# Patient Record
Sex: Female | Born: 1960
Health system: Southern US, Community
[De-identification: ages and names within clinical notes are randomized; demographics above are authoritative.]

## PROBLEM LIST (undated history)

## (undated) DIAGNOSIS — D6851 Activated protein C resistance: Secondary | ICD-10-CM

## (undated) DIAGNOSIS — K219 Gastro-esophageal reflux disease without esophagitis: Secondary | ICD-10-CM

## (undated) DIAGNOSIS — E119 Type 2 diabetes mellitus without complications: Secondary | ICD-10-CM

## (undated) DIAGNOSIS — N858 Other specified noninflammatory disorders of uterus: Secondary | ICD-10-CM

## (undated) DIAGNOSIS — J45990 Exercise induced bronchospasm: Secondary | ICD-10-CM

## (undated) HISTORY — DX: Activated protein C resistance: D68.51

## (undated) HISTORY — PX: WISDOM TOOTH EXTRACTION: SHX21

---

## 1994-02-28 HISTORY — PX: DILATION AND CURETTAGE OF UTERUS: SHX78

## 1997-04-01 ENCOUNTER — Encounter (HOSPITAL_COMMUNITY): Admission: RE | Admit: 1997-04-01 | Discharge: 1997-05-13 | Payer: Self-pay | Admitting: *Deleted

## 1997-04-04 ENCOUNTER — Inpatient Hospital Stay (HOSPITAL_COMMUNITY): Admission: AD | Admit: 1997-04-04 | Discharge: 1997-04-09 | Payer: Self-pay | Admitting: *Deleted

## 1997-04-19 ENCOUNTER — Inpatient Hospital Stay (HOSPITAL_COMMUNITY): Admission: AD | Admit: 1997-04-19 | Discharge: 1997-04-19 | Payer: Self-pay | Admitting: Obstetrics

## 1997-05-03 ENCOUNTER — Inpatient Hospital Stay (HOSPITAL_COMMUNITY): Admission: AD | Admit: 1997-05-03 | Discharge: 1997-05-05 | Payer: Self-pay | Admitting: *Deleted

## 1997-05-11 ENCOUNTER — Inpatient Hospital Stay (HOSPITAL_COMMUNITY): Admission: AD | Admit: 1997-05-11 | Discharge: 1997-05-15 | Payer: Self-pay | Admitting: *Deleted

## 1997-05-27 ENCOUNTER — Inpatient Hospital Stay (HOSPITAL_COMMUNITY): Admission: AD | Admit: 1997-05-27 | Discharge: 1997-05-27 | Payer: Self-pay | Admitting: *Deleted

## 1997-06-24 ENCOUNTER — Inpatient Hospital Stay (HOSPITAL_COMMUNITY): Admission: AD | Admit: 1997-06-24 | Discharge: 1997-06-24 | Payer: Self-pay | Admitting: *Deleted

## 1998-11-06 ENCOUNTER — Inpatient Hospital Stay (HOSPITAL_COMMUNITY): Admission: AD | Admit: 1998-11-06 | Discharge: 1998-11-06 | Payer: Self-pay | Admitting: *Deleted

## 1999-09-10 ENCOUNTER — Other Ambulatory Visit: Admission: RE | Admit: 1999-09-10 | Discharge: 1999-09-10 | Payer: Self-pay | Admitting: Family Medicine

## 2001-06-08 ENCOUNTER — Other Ambulatory Visit: Admission: RE | Admit: 2001-06-08 | Discharge: 2001-06-08 | Payer: Self-pay | Admitting: Family Medicine

## 2004-10-21 ENCOUNTER — Other Ambulatory Visit: Admission: RE | Admit: 2004-10-21 | Discharge: 2004-10-21 | Payer: Self-pay | Admitting: Family Medicine

## 2007-07-17 ENCOUNTER — Other Ambulatory Visit: Admission: RE | Admit: 2007-07-17 | Discharge: 2007-07-17 | Payer: Self-pay | Admitting: Family Medicine

## 2008-11-18 ENCOUNTER — Other Ambulatory Visit: Admission: RE | Admit: 2008-11-18 | Discharge: 2008-11-18 | Payer: Self-pay | Admitting: Family Medicine

## 2014-02-28 HISTORY — PX: COLONOSCOPY: SHX174

## 2015-07-01 ENCOUNTER — Ambulatory Visit (HOSPITAL_BASED_OUTPATIENT_CLINIC_OR_DEPARTMENT_OTHER)
Admission: RE | Admit: 2015-07-01 | Discharge: 2015-07-01 | Disposition: A | Discharge status: Home / Self Care | Payer: 59 | Source: Ambulatory Visit | Attending: Physician Assistant | Admitting: Physician Assistant

## 2015-07-01 ENCOUNTER — Other Ambulatory Visit (HOSPITAL_BASED_OUTPATIENT_CLINIC_OR_DEPARTMENT_OTHER): Payer: Self-pay | Admitting: Physician Assistant

## 2015-07-01 DIAGNOSIS — R229 Localized swelling, mass and lump, unspecified: Secondary | ICD-10-CM

## 2016-03-01 DIAGNOSIS — E119 Type 2 diabetes mellitus without complications: Secondary | ICD-10-CM | POA: Diagnosis not present

## 2016-03-01 DIAGNOSIS — H1030 Unspecified acute conjunctivitis, unspecified eye: Secondary | ICD-10-CM | POA: Diagnosis not present

## 2016-04-19 DIAGNOSIS — K529 Noninfective gastroenteritis and colitis, unspecified: Secondary | ICD-10-CM | POA: Diagnosis not present

## 2016-04-19 DIAGNOSIS — R6889 Other general symptoms and signs: Secondary | ICD-10-CM | POA: Diagnosis not present

## 2016-08-02 DIAGNOSIS — Z1231 Encounter for screening mammogram for malignant neoplasm of breast: Secondary | ICD-10-CM | POA: Diagnosis not present

## 2016-08-12 DIAGNOSIS — E782 Mixed hyperlipidemia: Secondary | ICD-10-CM | POA: Diagnosis not present

## 2016-08-12 DIAGNOSIS — E119 Type 2 diabetes mellitus without complications: Secondary | ICD-10-CM | POA: Diagnosis not present

## 2016-08-12 DIAGNOSIS — R030 Elevated blood-pressure reading, without diagnosis of hypertension: Secondary | ICD-10-CM | POA: Diagnosis not present

## 2016-08-12 DIAGNOSIS — Z0001 Encounter for general adult medical examination with abnormal findings: Secondary | ICD-10-CM | POA: Diagnosis not present

## 2016-09-30 DIAGNOSIS — N950 Postmenopausal bleeding: Secondary | ICD-10-CM | POA: Diagnosis not present

## 2016-09-30 DIAGNOSIS — R319 Hematuria, unspecified: Secondary | ICD-10-CM | POA: Diagnosis not present

## 2016-10-04 ENCOUNTER — Ambulatory Visit (HOSPITAL_BASED_OUTPATIENT_CLINIC_OR_DEPARTMENT_OTHER)
Admission: RE | Admit: 2016-10-04 | Discharge: 2016-10-04 | Disposition: A | Discharge status: Home / Self Care | Payer: 59 | Source: Ambulatory Visit | Attending: Internal Medicine | Admitting: Internal Medicine

## 2016-10-04 ENCOUNTER — Other Ambulatory Visit (HOSPITAL_BASED_OUTPATIENT_CLINIC_OR_DEPARTMENT_OTHER): Payer: Self-pay | Admitting: Internal Medicine

## 2016-10-04 ENCOUNTER — Ambulatory Visit (HOSPITAL_BASED_OUTPATIENT_CLINIC_OR_DEPARTMENT_OTHER): Payer: 59

## 2016-10-04 DIAGNOSIS — N950 Postmenopausal bleeding: Secondary | ICD-10-CM | POA: Insufficient documentation

## 2016-10-10 DIAGNOSIS — N950 Postmenopausal bleeding: Secondary | ICD-10-CM | POA: Diagnosis not present

## 2016-10-12 IMAGING — US US SOFT TISSUE HEAD/NECK
1 series · 14 of 16 positions shown · non-contrast
Comparison: None.

CLINICAL DATA: Palpable subcutaneous nodule of right posterior neck
at base of skull.

EXAM:
ULTRASOUND OF HEAD/NECK SOFT TISSUES
TECHNIQUE: Ultrasound examination of the head and neck soft tissues was
performed in the area of clinical concern.

[Series 1: us soft tissue head/neck · 0.04mm/px · 14 of 16 slices shown]
[im 1/16]
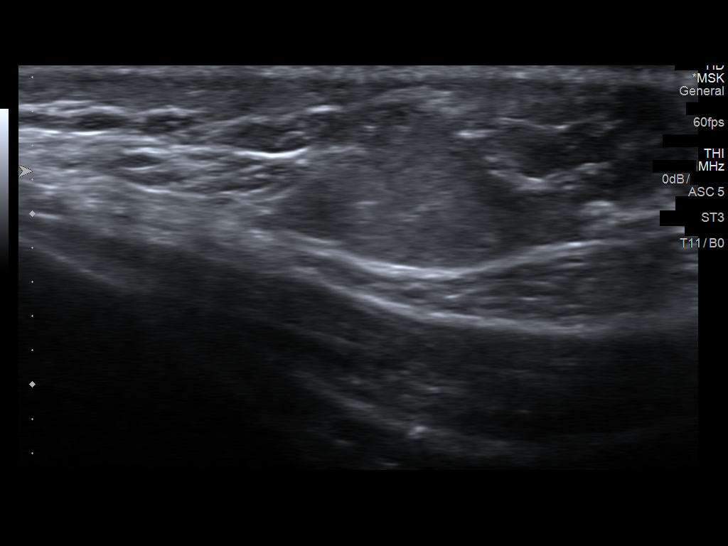
[im 2/16]
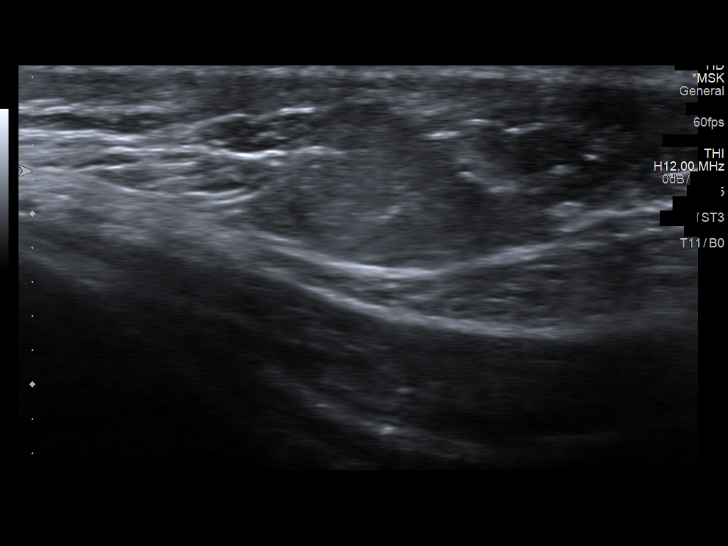
[im 3/16]
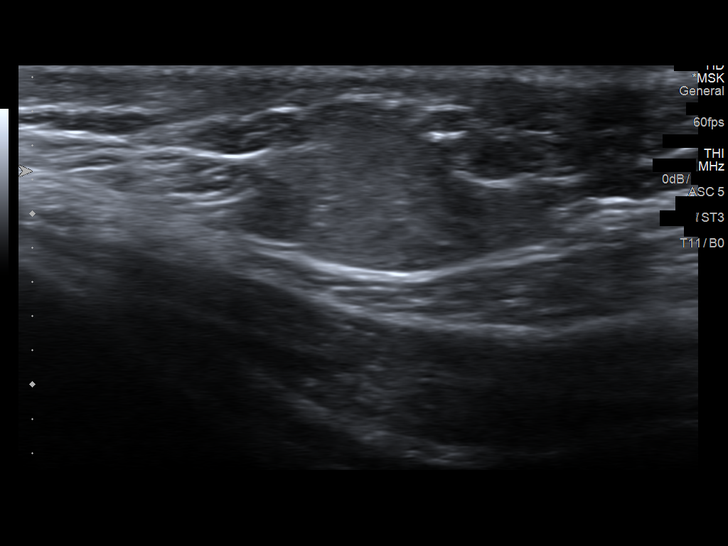
[im 5/16]
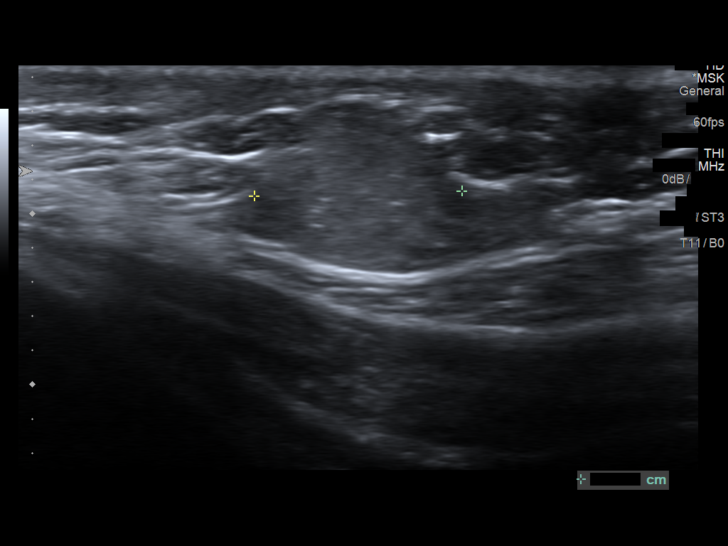
[im 6/16]
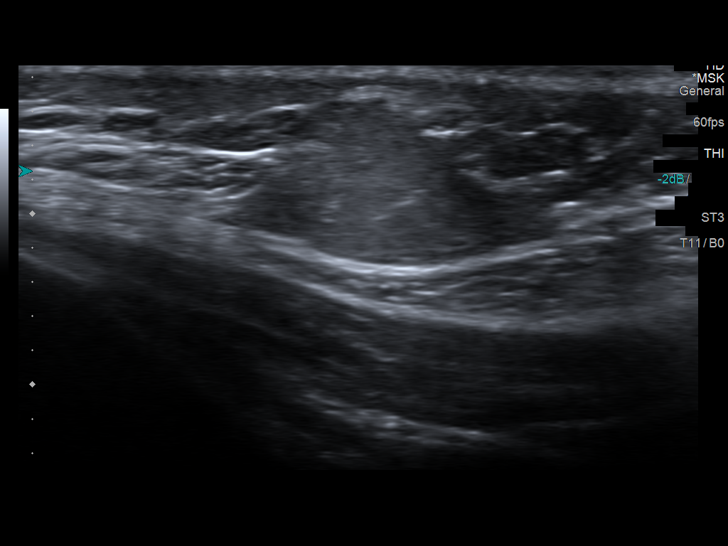
[im 7/16]
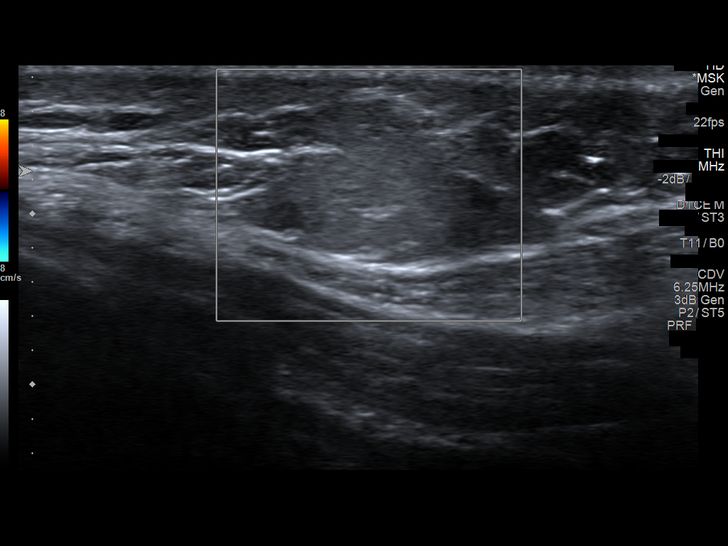
[im 8/16]
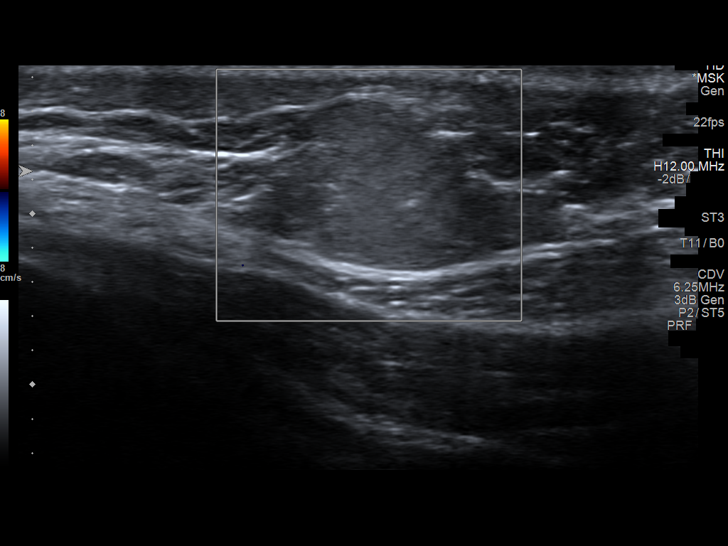
[im 9/16]
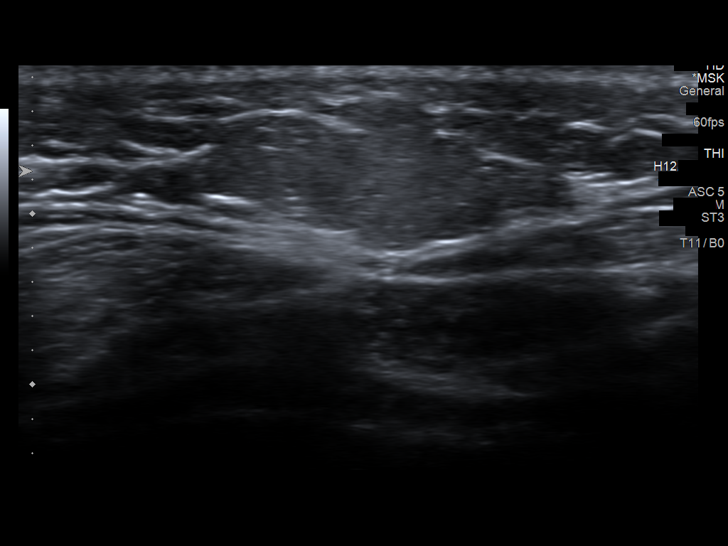
[im 10/16]
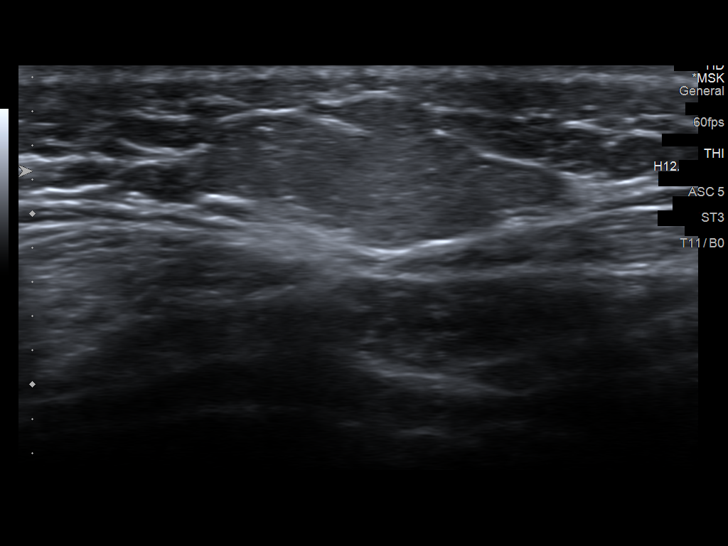
[im 11/16]
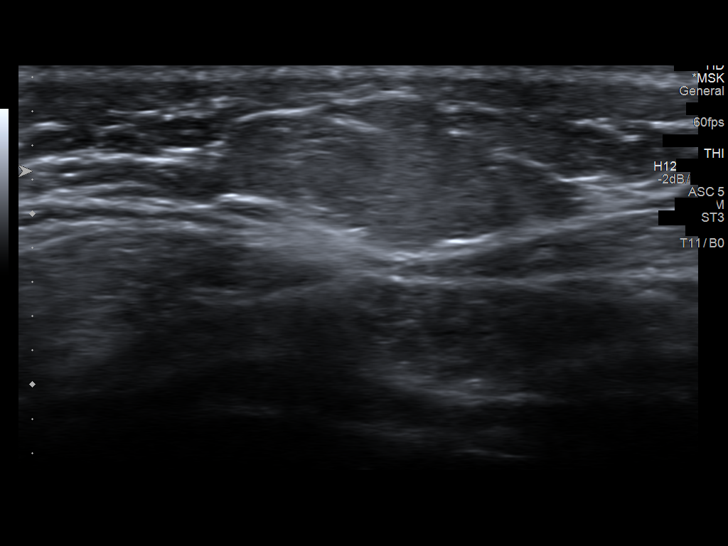
[im 13/16]
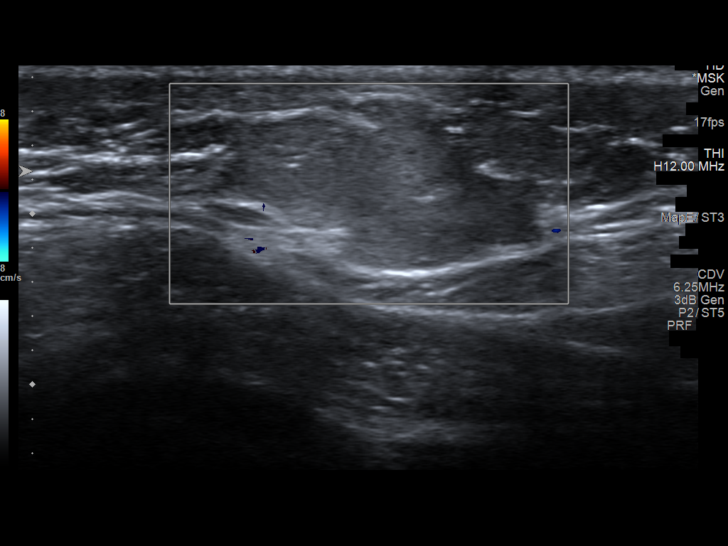
[im 14/16]
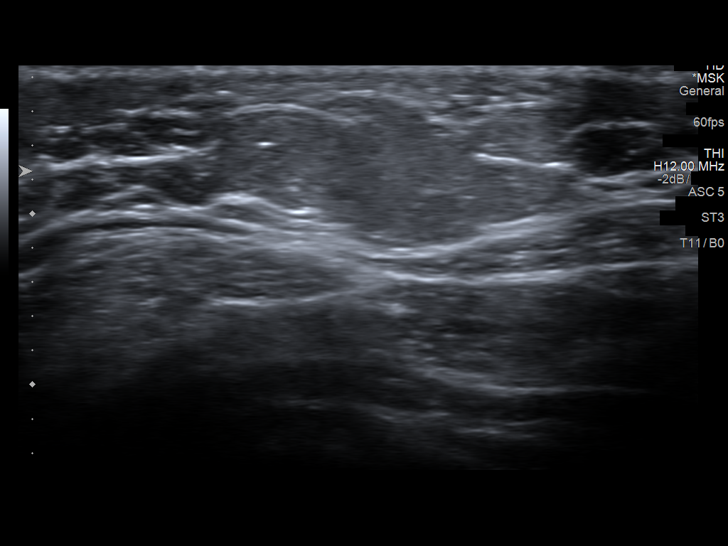
[im 15/16]
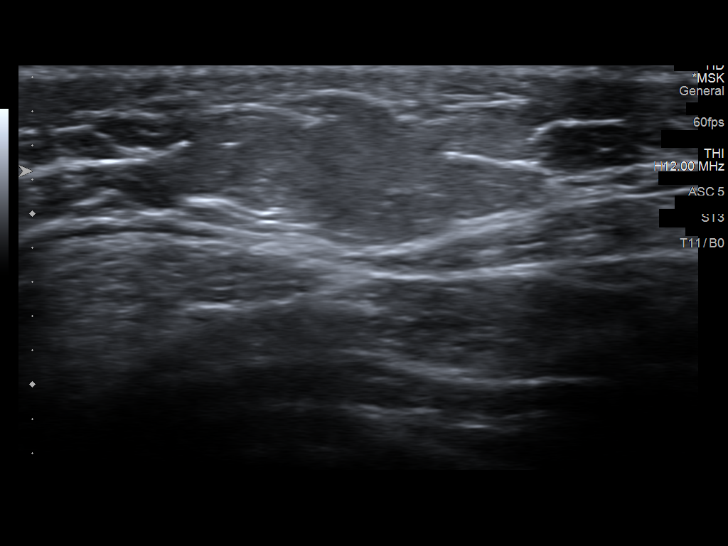
[im 16/16]
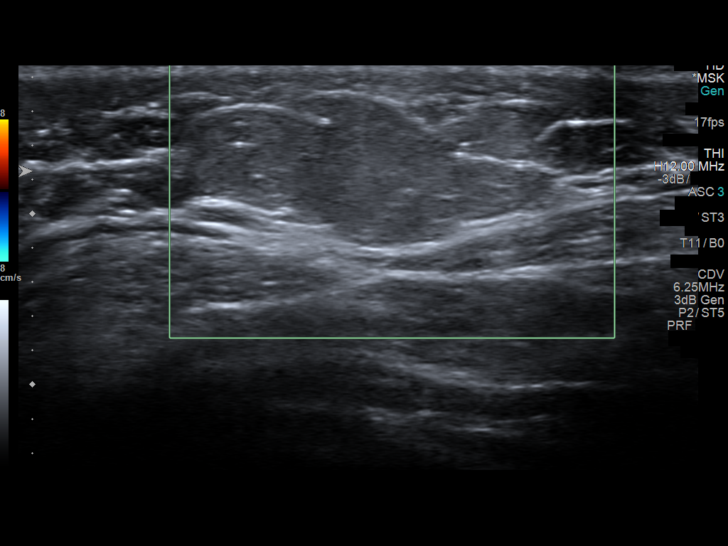

[14 of 16 positions shown; findings below may reference images not displayed]

FINDINGS: In the region of palpable abnormality, a subcutaneous soft tissue is
identified measuring roughly 2.2 x 0.9 x 1.2 cm. This does not have
the appearance of a typical lymph node. Differential considerations
include lipoma, complex sebaceous cyst and soft tissue neoplasm.
IMPRESSION: In the region of palpable abnormality, ultrasound demonstrates
subcutaneous soft tissue nodule measuring 2.2 cm in greatest
diameter. This is nonspecific in appearance by ultrasound.
Differential includes benign and malignant processes. If this region
does not resolve/ enlarges clinically, recommend correlation with CT
of the neck with contrast.

## 2016-10-20 ENCOUNTER — Telehealth: Payer: Self-pay | Admitting: *Deleted

## 2016-10-20 NOTE — Telephone Encounter (Signed)
Spoke with Helene Kelp at [place] and gave the date/time of the appt for August 31st. Patient to be seen by [facility] on August 31st at 1:45pm, arrive at 1:15pm. Helene Kelp will contact the patient

## 2016-10-28 ENCOUNTER — Encounter: Payer: Self-pay | Admitting: Gynecology

## 2016-10-28 ENCOUNTER — Ambulatory Visit: Payer: 59 | Attending: Gynecology | Admitting: Gynecology

## 2016-10-28 VITALS — BP 158/111 | HR 124 | Temp 97.8°F | Resp 18 | Ht 62.75 in | Wt 188.0 lb

## 2016-10-28 DIAGNOSIS — N9489 Other specified conditions associated with female genital organs and menstrual cycle: Secondary | ICD-10-CM

## 2016-10-28 DIAGNOSIS — N950 Postmenopausal bleeding: Secondary | ICD-10-CM | POA: Insufficient documentation

## 2016-10-28 DIAGNOSIS — N858 Other specified noninflammatory disorders of uterus: Secondary | ICD-10-CM | POA: Insufficient documentation

## 2016-10-28 DIAGNOSIS — N859 Noninflammatory disorder of uterus, unspecified: Secondary | ICD-10-CM | POA: Diagnosis not present

## 2016-10-28 DIAGNOSIS — E119 Type 2 diabetes mellitus without complications: Secondary | ICD-10-CM | POA: Insufficient documentation

## 2016-10-28 DIAGNOSIS — Z808 Family history of malignant neoplasm of other organs or systems: Secondary | ICD-10-CM | POA: Diagnosis not present

## 2016-10-28 DIAGNOSIS — R1909 Other intra-abdominal and pelvic swelling, mass and lump: Secondary | ICD-10-CM | POA: Diagnosis not present

## 2016-10-28 DIAGNOSIS — Z7982 Long term (current) use of aspirin: Secondary | ICD-10-CM | POA: Insufficient documentation

## 2016-10-28 DIAGNOSIS — D6851 Activated protein C resistance: Secondary | ICD-10-CM | POA: Insufficient documentation

## 2016-10-28 DIAGNOSIS — R1907 Generalized intra-abdominal and pelvic swelling, mass and lump: Secondary | ICD-10-CM

## 2016-10-28 NOTE — Patient Instructions (Signed)
Plan to have a MRI of the uterus.  We will call you with the results.  Please call for any questions or concerns.

## 2016-10-28 NOTE — Progress Notes (Signed)
Consult Note: Gyn-Onc   Sue Dickson 56 y.o. female  No chief complaint on file.   Assessment : Postmenopausal bleeding and uterine mass on certain etiology.  Plan: I would like to obtain an MRI of the pelvis to further delineate the nature of this mass. I discussed at length with the patient her husband and daughter the differential diagnosis and whether this mass was ultimately resected transcervically (hysteroscopic [place]) or through performing an abdominal hysterectomy. We will make that decision was recently the results of MRI.  HPI: 56 year old white married female gravida 3 para 2 seen in consultation request of Dr.Ozan regarding management of a newly diagnosed uterine mass. The patient presented with postmenopausal bleeding approximate 14 months after undergoing menopause. An transvaginal ultrasound revealed the uterus to measure 10 x 7 x 6.2 cm. The endometrium was thickened at 4.5 cm with a solid mass noted within the endometrial canal measuring 4.6 x 4.2 x 3.9 cm. The ovaries appeared normal. There is no free fluid. The mass was to the fine as having significant internal blood flow. Endometrial biopsy was performed showing proliferative endometrium but no hyperplasia or malignancy.  The patient denies any past gynecologic history. She has no family history of breast ovarian uterine or colon cancer.  Review of Systems:10 point review of systems is negative except as noted in interval history.   Vitals: Blood pressure (!) 158/111, pulse (!) 124, temperature 97.8 F (36.6 C), temperature source Oral, resp. rate 18, height 5' 2.75" (1.594 m), weight 188 lb (85.3 kg), SpO2 100 %.  Physical Exam: General : The patient is a healthy woman in no acute distress.  HEENT: normocephalic, extraoccular movements normal; neck is supple without thyromegally  Lynphnodes: Supraclavicular and inguinal nodes not enlarged  Abdomen: Soft, non-tender, no ascites, no organomegally, no masses, no hernias   Pelvic:  EGBUS: Normal female  Vagina: Normal, no lesions  Urethra and Bladder: Normal, non-tender  Cervix: Normal Uterus: Unable to evaluate given the patient's habitus. Bi-manual examination: Non-tender; no adenxal masses or nodularity  Rectal: normal sphincter tone, no masses, no blood  Lower extremities: No edema or varicosities. Normal range of motion      No Known Allergies  Past Medical History:  Diagnosis Date  . Diabetes mellitus without complication ([place])    Recently diagnosed 10-28-16. Curently diet controled  . Factor 5 Leiden mutation, heterozygous ([place])     No past surgical history on file.  Current Outpatient Prescriptions  Medication Sig Dispense Refill  . ALPRAZolam (XANAX) 0.25 MG tablet Take 0.25 mg by mouth daily as needed for anxiety.    Marland Kitchen aspirin EC 81 MG tablet Take 81 mg by mouth daily.    Marland Kitchen buPROPion (WELLBUTRIN XL) 150 MG 24 hr tablet TAKE 1 TABLET BY MOUTH EVERY DAY IN THE MORNING  5  . cyclobenzaprine (FLEXERIL) 10 MG tablet Take 10 mg by mouth 3 (three) times daily as needed.  0  . esomeprazole (NEXIUM) 20 MG capsule Take 20 mg by mouth every morning.     No current facility-administered medications for this visit.     Social History   Social History  . Marital status: Married    Spouse name: N/A  . Number of children: N/A  . Years of education: N/A   Occupational History  . Not on file.   Social History Main Topics  . Smoking status: Not on file  . Smokeless tobacco: Not on file  . Alcohol use Not on file  . Drug use:  Unknown  . Sexual activity: Not on file   Other Topics Concern  . Not on file   Social History Narrative  . No narrative on file    Family History  Problem Relation Age of Onset  . Uterine cancer Maternal Aunt       Marti Sleigh, MD 10/28/2016, 2:26 PM

## 2016-11-03 ENCOUNTER — Ambulatory Visit (HOSPITAL_COMMUNITY)
Admission: RE | Admit: 2016-11-03 | Discharge: 2016-11-03 | Disposition: A | Discharge status: Home / Self Care | Payer: 59 | Source: Ambulatory Visit | Attending: Gynecologic Oncology | Admitting: Gynecologic Oncology

## 2016-11-03 ENCOUNTER — Encounter (HOSPITAL_COMMUNITY): Payer: Self-pay | Admitting: Radiology

## 2016-11-03 DIAGNOSIS — R1907 Generalized intra-abdominal and pelvic swelling, mass and lump: Secondary | ICD-10-CM | POA: Diagnosis not present

## 2016-11-03 DIAGNOSIS — N950 Postmenopausal bleeding: Secondary | ICD-10-CM | POA: Insufficient documentation

## 2016-11-03 DIAGNOSIS — N9489 Other specified conditions associated with female genital organs and menstrual cycle: Secondary | ICD-10-CM

## 2016-11-03 MED ORDER — GADOBENATE DIMEGLUMINE 529 MG/ML IV SOLN
20.0000 mL | Freq: Once | INTRAVENOUS | Status: AC | PRN
  Administered 2016-11-03: 17 mL via INTRAVENOUS

## 2016-11-07 ENCOUNTER — Telehealth: Payer: Self-pay | Admitting: Gynecologic Oncology

## 2016-11-07 NOTE — Telephone Encounter (Signed)
Spoke with patient about MRI results and Dr. Mora Bellman recommendations for her to be seen to discuss D&C.  Appt made for Sept 12 and D&C scheduled for Sept 19.  All questions answered.

## 2016-11-09 ENCOUNTER — Encounter: Payer: Self-pay | Admitting: Gynecologic Oncology

## 2016-11-09 ENCOUNTER — Ambulatory Visit: Payer: 59 | Attending: Gynecologic Oncology | Admitting: Gynecologic Oncology

## 2016-11-09 ENCOUNTER — Other Ambulatory Visit: Payer: Self-pay | Admitting: Gynecologic Oncology

## 2016-11-09 VITALS — BP 150/100 | HR 108 | Temp 98.0°F | Resp 18 | Wt 188.0 lb

## 2016-11-09 DIAGNOSIS — D6851 Activated protein C resistance: Secondary | ICD-10-CM | POA: Diagnosis not present

## 2016-11-09 DIAGNOSIS — E119 Type 2 diabetes mellitus without complications: Secondary | ICD-10-CM | POA: Insufficient documentation

## 2016-11-09 DIAGNOSIS — Z7982 Long term (current) use of aspirin: Secondary | ICD-10-CM | POA: Insufficient documentation

## 2016-11-09 DIAGNOSIS — N950 Postmenopausal bleeding: Secondary | ICD-10-CM | POA: Insufficient documentation

## 2016-11-09 DIAGNOSIS — N9489 Other specified conditions associated with female genital organs and menstrual cycle: Secondary | ICD-10-CM

## 2016-11-09 DIAGNOSIS — Z79899 Other long term (current) drug therapy: Secondary | ICD-10-CM | POA: Diagnosis not present

## 2016-11-09 DIAGNOSIS — Z808 Family history of malignant neoplasm of other organs or systems: Secondary | ICD-10-CM | POA: Insufficient documentation

## 2016-11-09 DIAGNOSIS — N858 Other specified noninflammatory disorders of uterus: Secondary | ICD-10-CM

## 2016-11-09 NOTE — Progress Notes (Signed)
Consult Note: Gyn-Onc   Sue Dickson 56 y.o. female  Chief Complaint  Patient presents with  . Endometrial mass N94.89]    Assessment : Postmenopausal bleeding and uterine mass on certain etiology.  Plan:We will proceed with a ultrasound-guided D&C and possible hysteroscopy next Wednesday. Her questions were elicited in answer to their satisfaction. They know that we will call them with the results and determine next steps pending them.  HPI: 76 year old white married female gravida 3 para 2 seen in consultation request of Dr.Ozan regarding management of a newly diagnosed uterine mass. The patient presented with postmenopausal bleeding approximate 14 months after undergoing menopause. An transvaginal ultrasound revealed the uterus to measure 10 x 7 x 6.2 cm. The endometrium was thickened at 4.5 cm with a solid mass noted within the endometrial canal measuring 4.6 x 4.2 x 3.9 cm. The ovaries appeared normal. There is no free fluid. The mass was to the fine as having significant internal blood flow. Endometrial biopsy was performed showing proliferative endometrium but no hyperplasia or malignancy.  Reproductive: Uterus is notable for a 4.8 x 3.4 x 3.7 cm enhancing mass in the uterine fundus (series 11/image 18). Mildly irregular margins. Thinning of the overlying uterine fundus with possible myometrial invasion. Overall, this appearance favors endometrial carcinoma, with endometrial polyp and degenerating intracavitary fibroid considered less likely.  If this reflects carcinoma, this would likely reflect stage IB disease. No involvement of the cervix or adjacent structures.  IMPRESSION: 4.8 cm enhancing mass in the uterine fundus, worrisome for endometrial carcinoma (stage IB) in the setting of postmenopausal bleeding.  Greater than 20 minutes face to face time with the patient and her husband reviewing the MRI results as well as next steps. She is scheduled for a D&C next Wednesday with me.  They had a lot of questions about the possibilities and I discussed with them that I cannot offer more information and we'll was provided in the MRI that this could either be an endometrial cancer, a fibroid or polyp. I discussed with them that we would try to remove his metastasis of the mass as possible. If it is an invasive endometrial cancer would not be able to remove the entire cancer as it does involve 12 the myometrium. The questions about what next steps would be involved but there was an endometrial cancer and I briefly discussed that it would be a hysterectomy and we did discuss the role of robotic and minimally invasive surgery. They then got into some details are really too premature to discuss such as the role of chemotherapy and radiation. I discussed with them that I be happy to answer this question should the need arise business really focus on getting a diagnosis.  They are comfortable with [place] moving forward with a D&C on Wednesday. They know that we will call them with the results and determine her disposition pending them.  No Known Allergies  Past Medical History:  Diagnosis Date  . Diabetes mellitus without complication ([place])    Recently diagnosed 10-28-16. Curently diet controled  . Factor 5 Leiden mutation, heterozygous [facility])     History reviewed. No pertinent surgical history.  Current Outpatient Prescriptions  Medication Sig Dispense Refill  . ALPRAZolam (XANAX) 0.25 MG tablet Take 0.25 mg by mouth daily as needed for anxiety.    Marland Kitchen aspirin EC 81 MG tablet Take 81 mg by mouth daily.    Marland Kitchen buPROPion (WELLBUTRIN XL) 150 MG 24 hr tablet TAKE 1 TABLET BY MOUTH EVERY DAY  IN THE MORNING  5  . cyclobenzaprine (FLEXERIL) 10 MG tablet Take 10 mg by mouth 3 (three) times daily as needed.  0  . esomeprazole (NEXIUM) 20 MG capsule Take 20 mg by mouth every morning.     No current facility-administered medications for this visit.     Social History   Social History  . Marital  status: Married    Spouse name: N/A  . Number of children: N/A  . Years of education: N/A   Occupational History  . Not on file.   Social History Main Topics  . Smoking status: Never Smoker  . Smokeless tobacco: Never Used  . Alcohol use No  . Drug use: No  . Sexual activity: Not on file   Other Topics Concern  . Not on file   Social History Narrative  . No narrative on file    Family History  Problem Relation Age of Onset  . Uterine cancer Maternal Aunt     Vitals: Blood pressure (!) 150/100, pulse (!) 108, temperature 98 F (36.7 C), temperature source Oral, resp. rate 18, weight 188 lb (85.3 kg), SpO2 100 %.  Physical Exam: General : The patient is a healthy woman in no acute distress.   Adamarys Shall A., MD 11/09/2016, 2:13 PM

## 2016-11-09 NOTE — Patient Instructions (Signed)
Plan to have a Dilation and Curettage at the [facility] on November 16, 2016 with Dr. Alycia Rossetti.  You will receive a phone call from the pre-surgical RN to discuss instructions.  Please call for any questions or concerns.   Dilation and Curettage or Vacuum Curettage  Dilation and curettage (D&C) and vacuum curettage are minor procedures. A D&C involves stretching (dilation) the cervix and scraping (curettage) the inside lining of the uterus (endometrium). During a D&C, tissue is gently scraped from the endometrium, starting from the top portion of the uterus down to the lowest part of the uterus (cervix). During a vacuum curettage, the lining and tissue in the uterus are removed with the use of gentle suction. Curettage may be performed to either diagnose or treat a problem. As a diagnostic procedure, curettage is performed to examine tissues from the uterus. A diagnostic curettage may be done if you have:  Irregular bleeding in the uterus.  Bleeding with the development of clots.  Spotting between menstrual periods.  Prolonged menstrual periods or other abnormal bleeding.  Bleeding after menopause.  No menstrual period (amenorrhea).  A change in size and shape of the uterus.  Abnormal endometrial cells discovered during a Pap test.  As a treatment procedure, curettage may be performed for the following reasons:  Removal of an IUD (intrauterine device).  Removal of retained placenta after giving birth.  Abortion.  Miscarriage.  Removal of endometrial polyps.  Removal of uncommon types of noncancerous lumps (fibroids).  Tell a health care provider about:  Any allergies you have, including allergies to prescribed medicine or latex.  All medicines you are taking, including vitamins, herbs, eye drops, creams, and over-the-counter medicines. This is especially important if you take any blood-thinning medicine. Bring a list of all of your medicines to your  appointment.  Any problems you or family members have had with anesthetic medicines.  Any blood disorders you have.  Any surgeries you have had.  Your medical history and any medical conditions you have.  Whether you are pregnant or may be pregnant.  Recent vaginal infections you have had.  Recent menstrual periods, bleeding problems you have had, and what form of birth control (contraception) you use. What are the risks? Generally, this is a safe procedure. However, problems may occur, including:  Infection.  Heavy vaginal bleeding.  Allergic reactions to medicines.  Damage to the cervix or other structures or organs.  Development of scar tissue (adhesions) inside the uterus, which can cause abnormal amounts of menstrual bleeding. This may make it harder to get pregnant in the future.  A hole (perforation) or puncture in the uterine wall. This is rare.  What happens before the procedure? Staying hydrated Follow instructions from your health care provider about hydration, which may include:  Up to 2 hours before the procedure - you may continue to drink clear liquids, such as water, clear fruit juice, black coffee, and plain tea.  Eating and drinking restrictions Follow instructions from your health care provider about eating and drinking, which may include:  8 hours before the procedure - stop eating heavy meals or foods such as meat, fried foods, or fatty foods.  6 hours before the procedure - stop eating light meals or foods, such as toast or cereal.  6 hours before the procedure - stop drinking milk or drinks that contain milk.  2 hours before the procedure - stop drinking clear liquids. If your health care provider told you to take your  medicine(s) on the day of your procedure, take them with only a sip of water.  Medicines  Ask your health care provider about: ? Changing or stopping your regular medicines. This is especially important if you are taking diabetes  medicines or blood thinners. ? Taking medicines such as aspirin and ibuprofen. These medicines can thin your blood. Do not take these medicines before your procedure if your health care provider instructs you not to.  You may be given antibiotic medicine to help prevent infection. General instructions  For 24 hours before your procedure, do not: ? Douche. ? Use tampons. ? Use medicines, creams, or suppositories in the vagina. ? Have sexual intercourse.  You may be given a pregnancy test on the day of the procedure.  Plan to have someone take you home from the hospital or clinic.  You may have a blood or urine sample taken.  If you will be going home right after the procedure, plan to have someone with you for 24 hours. What happens during the procedure?  To reduce your risk of infection: ? Your health care team will wash or sanitize their hands. ? Your skin will be washed with soap.  An IV tube will be inserted into one of your veins.  You will be given one of the following: ? A medicine that numbs the area in and around the cervix (local anesthetic). ? A medicine to make you fall asleep (general anesthetic).  You will lie down on your back, with your feet in foot rests (stirrups).  The size and position of your uterus will be checked.  A lubricated instrument (speculum or Sims retractor) will be inserted into the back side of your vagina. The speculum will be used to hold apart the walls of your vagina so your health care provider can see your cervix.  A tool (tenaculum) will be attached to the lip of the cervix to stabilize it.  Your cervix will be softened and dilated. This may be done by: ? Taking a medicine. ? Having tapered dilators or thin rods (laminaria) or gradual widening instruments (tapered dilators) inserted into your cervix.  A small, sharp, curved instrument (curette) will be used to scrape a small amount of tissue or cells from the endometrium or cervical  canal. In some cases, gentle suction is applied with the curette. The curette will then be removed. The cells will be taken to a lab for testing. The procedure may vary among health care providers and hospitals. What happens after the procedure?  You may have mild cramping, backache, pain, and light bleeding or spotting. You may pass small blood clots from your vagina.  You may have to wear compression stockings. These stockings help to prevent blood clots and reduce swelling in your legs.  Your blood pressure, heart rate, breathing rate, and blood oxygen level will be monitored until the medicines you were given have worn off. Summary  Dilation and curettage (D&C) involves stretching (dilation) the cervix and scraping (curettage) the inside lining of the uterus (endometrium).  After the procedure, you may have mild cramping, backache, pain, and light bleeding or spotting. You may pass small blood clots from your vagina.  Plan to have someone take you home from the hospital or clinic. This information is not intended to replace advice given to you by your health care provider. Make sure you discuss any questions you have with your health care provider. Document Released: 02/14/2005 Document Revised: 11/01/2015 Document Reviewed: 11/01/2015 Elsevier Interactive Patient  Education  2018 [employer].

## 2016-11-10 ENCOUNTER — Encounter (HOSPITAL_BASED_OUTPATIENT_CLINIC_OR_DEPARTMENT_OTHER): Payer: Self-pay | Admitting: *Deleted

## 2016-11-10 NOTE — Progress Notes (Signed)
NPO AFTER MN.  ARRIVE AT 0700.  NEEDS ISTAT AND EKG.  WILL TAKE WELLBUTRIN AND NEXIUM AM DOS W/ SIPS OF WATER.

## 2016-11-15 NOTE — Anesthesia Preprocedure Evaluation (Addendum)
Anesthesia Evaluation  Patient identified by MRN, date of birth, ID band Patient awake    Reviewed: Allergy & Precautions, NPO status , Patient's Chart, lab work & pertinent test results  History of Anesthesia Complications Negative for: history of anesthetic complications  Airway Mallampati: II  TM Distance: >3 FB Neck ROM: Full    Dental no notable dental hx. (+) Dental Advisory Given   Pulmonary neg pulmonary ROS, asthma ,    Pulmonary exam normal        Cardiovascular negative cardio ROS Normal cardiovascular exam     Neuro/Psych negative neurological ROS  negative psych ROS   GI/Hepatic negative GI ROS, Neg liver ROS, GERD  ,  Endo/Other  negative endocrine ROSdiabetes  Renal/GU negative Renal ROS  negative genitourinary   Musculoskeletal negative musculoskeletal ROS (+)   Abdominal   Peds negative pediatric ROS (+)  Hematology negative hematology ROS (+)   Anesthesia Other Findings   Reproductive/Obstetrics negative OB ROS                            Anesthesia Physical Anesthesia Plan  ASA: II  Anesthesia Plan: General   Post-op Pain Management:    Induction: Intravenous  PONV Risk Score and Plan: 3 and Ondansetron, Dexamethasone and Scopolamine patch - Pre-op  Airway Management Planned: LMA  Additional Equipment:   Intra-op Plan:   Post-operative Plan: Extubation in OR  Informed Consent: I have reviewed the patients History and Physical, chart, labs and discussed the procedure including the risks, benefits and alternatives for the proposed anesthesia with the patient or authorized representative who has indicated his/her understanding and acceptance.   Dental advisory given  Plan Discussed with: CRNA, Anesthesiologist and Surgeon  Anesthesia Plan Comments:        Anesthesia Quick Evaluation

## 2016-11-16 ENCOUNTER — Ambulatory Visit (HOSPITAL_BASED_OUTPATIENT_CLINIC_OR_DEPARTMENT_OTHER): Payer: 59 | Admitting: Anesthesiology

## 2016-11-16 ENCOUNTER — Encounter (HOSPITAL_BASED_OUTPATIENT_CLINIC_OR_DEPARTMENT_OTHER)
Admission: RE | Disposition: A | Discharge status: Home / Self Care | Payer: Self-pay | Source: Ambulatory Visit | Attending: Gynecologic Oncology

## 2016-11-16 ENCOUNTER — Encounter (HOSPITAL_BASED_OUTPATIENT_CLINIC_OR_DEPARTMENT_OTHER): Payer: Self-pay

## 2016-11-16 ENCOUNTER — Other Ambulatory Visit: Payer: Self-pay

## 2016-11-16 ENCOUNTER — Ambulatory Visit (HOSPITAL_COMMUNITY)
Admission: RE | Admit: 2016-11-16 | Discharge: 2016-11-16 | Disposition: A | Discharge status: Home / Self Care | Payer: 59 | Source: Ambulatory Visit | Attending: Gynecologic Oncology | Admitting: Gynecologic Oncology

## 2016-11-16 DIAGNOSIS — Z79899 Other long term (current) drug therapy: Secondary | ICD-10-CM | POA: Insufficient documentation

## 2016-11-16 DIAGNOSIS — Z833 Family history of diabetes mellitus: Secondary | ICD-10-CM | POA: Insufficient documentation

## 2016-11-16 DIAGNOSIS — N859 Noninflammatory disorder of uterus, unspecified: Secondary | ICD-10-CM | POA: Diagnosis not present

## 2016-11-16 DIAGNOSIS — N858 Other specified noninflammatory disorders of uterus: Secondary | ICD-10-CM

## 2016-11-16 DIAGNOSIS — N950 Postmenopausal bleeding: Secondary | ICD-10-CM | POA: Insufficient documentation

## 2016-11-16 DIAGNOSIS — R1909 Other intra-abdominal and pelvic swelling, mass and lump: Secondary | ICD-10-CM | POA: Diagnosis present

## 2016-11-16 DIAGNOSIS — D6851 Activated protein C resistance: Secondary | ICD-10-CM | POA: Diagnosis not present

## 2016-11-16 DIAGNOSIS — Z8049 Family history of malignant neoplasm of other genital organs: Secondary | ICD-10-CM | POA: Insufficient documentation

## 2016-11-16 HISTORY — DX: Other specified noninflammatory disorders of uterus: N85.8

## 2016-11-16 HISTORY — DX: Exercise induced bronchospasm: J45.990

## 2016-11-16 HISTORY — PX: DILATION AND CURETTAGE OF UTERUS: SHX78

## 2016-11-16 HISTORY — DX: Type 2 diabetes mellitus without complications: E11.9

## 2016-11-16 HISTORY — DX: Gastro-esophageal reflux disease without esophagitis: K21.9

## 2016-11-16 LAB — POCT I-STAT 4, (NA,K, GLUC, HGB,HCT)
Glucose, Bld: 195 mg/dL — ABNORMAL HIGH (ref 65–99)
HCT: 46 % (ref 36.0–46.0)
HEMOGLOBIN: 15.6 g/dL — AB (ref 12.0–15.0)
POTASSIUM: 4 mmol/L (ref 3.5–5.1)
Sodium: 139 mmol/L (ref 135–145)

## 2016-11-16 LAB — GLUCOSE, CAPILLARY: GLUCOSE-CAPILLARY: 156 mg/dL — AB (ref 65–99)

## 2016-11-16 SURGERY — DILATION AND CURETTAGE
Anesthesia: General | Site: Vagina

## 2016-11-16 MED ORDER — SCOPOLAMINE 1 MG/3DAYS TD PT72
MEDICATED_PATCH | TRANSDERMAL | Status: AC
  Filled 2016-11-16: qty 1

## 2016-11-16 MED ORDER — ONDANSETRON HCL 4 MG/2ML IJ SOLN
INTRAMUSCULAR | Status: DC | PRN
Start: 2016-11-16 — End: 2016-11-16
  Administered 2016-11-16: 4 mg via INTRAVENOUS

## 2016-11-16 MED ORDER — PROMETHAZINE HCL 25 MG/ML IJ SOLN
6.2500 mg | INTRAMUSCULAR | Status: DC | PRN
  Filled 2016-11-16: qty 1

## 2016-11-16 MED ORDER — MIDAZOLAM HCL 2 MG/2ML IJ SOLN
INTRAMUSCULAR | Status: AC
  Filled 2016-11-16: qty 2

## 2016-11-16 MED ORDER — KETOROLAC TROMETHAMINE 30 MG/ML IJ SOLN
INTRAMUSCULAR | Status: AC
  Filled 2016-11-16: qty 1

## 2016-11-16 MED ORDER — LACTATED RINGERS IV SOLN
INTRAVENOUS | Status: DC
  Administered 2016-11-16: 1000 mL via INTRAVENOUS
  Filled 2016-11-16: qty 1000

## 2016-11-16 MED ORDER — PROPOFOL 10 MG/ML IV BOLUS
INTRAVENOUS | Status: DC | PRN
  Administered 2016-11-16: 20 mg via INTRAVENOUS
  Administered 2016-11-16: 180 mg via INTRAVENOUS

## 2016-11-16 MED ORDER — DEXAMETHASONE SODIUM PHOSPHATE 10 MG/ML IJ SOLN
INTRAMUSCULAR | Status: AC
  Filled 2016-11-16: qty 1

## 2016-11-16 MED ORDER — SODIUM CHLORIDE 0.9 % IR SOLN
Status: DC | PRN
  Administered 2016-11-16: 3000 mL

## 2016-11-16 MED ORDER — DEXAMETHASONE SODIUM PHOSPHATE 10 MG/ML IJ SOLN
INTRAMUSCULAR | Status: DC | PRN
  Administered 2016-11-16: 10 mg via INTRAVENOUS

## 2016-11-16 MED ORDER — LIDOCAINE 2% (20 MG/ML) 5 ML SYRINGE
INTRAMUSCULAR | Status: DC | PRN
  Administered 2016-11-16: 100 mg via INTRAVENOUS

## 2016-11-16 MED ORDER — LIDOCAINE-EPINEPHRINE (PF) 1 %-1:200000 IJ SOLN
INTRAMUSCULAR | Status: DC | PRN
  Administered 2016-11-16: 10 mL

## 2016-11-16 MED ORDER — ONDANSETRON HCL 4 MG/2ML IJ SOLN
INTRAMUSCULAR | Status: AC
  Filled 2016-11-16: qty 2

## 2016-11-16 MED ORDER — HYDROMORPHONE HCL 1 MG/ML IJ SOLN
0.2500 mg | INTRAMUSCULAR | Status: DC | PRN
  Filled 2016-11-16: qty 0.5

## 2016-11-16 MED ORDER — FENTANYL CITRATE (PF) 100 MCG/2ML IJ SOLN
INTRAMUSCULAR | Status: AC
  Filled 2016-11-16: qty 2

## 2016-11-16 MED ORDER — KETOROLAC TROMETHAMINE 30 MG/ML IJ SOLN
INTRAMUSCULAR | Status: DC | PRN
  Administered 2016-11-16: 30 mg via INTRAVENOUS

## 2016-11-16 MED ORDER — OXYCODONE-ACETAMINOPHEN 5-325 MG PO TABS: 1.0000 | ORAL_TABLET | Freq: Four times a day (QID) | ORAL | 0 refills | Status: AC | PRN

## 2016-11-16 MED ORDER — PROPOFOL 10 MG/ML IV BOLUS
INTRAVENOUS | Status: AC
  Filled 2016-11-16: qty 40

## 2016-11-16 MED ORDER — LIDOCAINE 2% (20 MG/ML) 5 ML SYRINGE
INTRAMUSCULAR | Status: AC
  Filled 2016-11-16: qty 5

## 2016-11-16 MED ORDER — MIDAZOLAM HCL 5 MG/5ML IJ SOLN
INTRAMUSCULAR | Status: DC | PRN
  Administered 2016-11-16: 2 mg via INTRAVENOUS

## 2016-11-16 MED ORDER — SCOPOLAMINE 1 MG/3DAYS TD PT72
1.0000 | MEDICATED_PATCH | TRANSDERMAL | Status: DC
  Administered 2016-11-16: 1.5 mg via TRANSDERMAL
  Filled 2016-11-16: qty 1

## 2016-11-16 MED ORDER — FENTANYL CITRATE (PF) 100 MCG/2ML IJ SOLN
INTRAMUSCULAR | Status: DC | PRN
  Administered 2016-11-16 (×2): 50 ug via INTRAVENOUS

## 2016-11-16 SURGICAL SUPPLY — 42 items
BAG URINE DRAINAGE (UROLOGICAL SUPPLIES) IMPLANT
CATH FOLEY 2WAY SLVR 30CC 16FR (CATHETERS) IMPLANT
CATH ROBINSON RED A/P 16FR (CATHETERS) ×3 IMPLANT
CLOTH BEACON ORANGE TIMEOUT ST (SAFETY) ×3 IMPLANT
COVER TABLE BACK 60X90 (DRAPES) ×3 IMPLANT
DRAPE HYSTEROSCOPY (DRAPE) ×3 IMPLANT
DRAPE SHEET LG 3/4 BI-LAMINATE (DRAPES) ×3 IMPLANT
DRAPE UNDERBUTTOCKS STRL (DRAPE) IMPLANT
DRSG TELFA 3X8 NADH (GAUZE/BANDAGES/DRESSINGS) ×3 IMPLANT
GLOVE BIO SURGEON STRL SZ 6.5 (GLOVE) ×2 IMPLANT
GLOVE BIO SURGEONS STRL SZ 6.5 (GLOVE) ×1
GLOVE BIOGEL PI IND STRL 6.5 (GLOVE) ×1 IMPLANT
GLOVE BIOGEL PI IND STRL 7.5 (GLOVE) ×1 IMPLANT
GLOVE BIOGEL PI INDICATOR 6.5 (GLOVE) ×2
GLOVE BIOGEL PI INDICATOR 7.5 (GLOVE) ×2
GLOVE ENCORE ORTHOPEDIC 6.5 PF (GLOVE) ×3 IMPLANT
GOWN STRL REUS W/TWL LRG LVL3 (GOWN DISPOSABLE) ×6 IMPLANT
GOWN W/2 COTTON TOWELS 2 STD (GOWNS) IMPLANT
KIT BERKELEY 1ST TRIMESTER 3/8 (MISCELLANEOUS) IMPLANT
KIT RM TURNOVER CYSTO AR (KITS) ×3 IMPLANT
LEGGING LITHOTOMY PAIR STRL (DRAPES) ×3 IMPLANT
MANIFOLD NEPTUNE II (INSTRUMENTS) IMPLANT
NEEDLE SPNL 22GX3.5 QUINCKE BK (NEEDLE) ×3 IMPLANT
NS IRRIG 500ML POUR BTL (IV SOLUTION) ×3 IMPLANT
PACK BASIN DAY SURGERY FS (CUSTOM PROCEDURE TRAY) ×3 IMPLANT
PAD OB MATERNITY 4.3X12.25 (PERSONAL CARE ITEMS) ×3 IMPLANT
PAD PREP 24X48 CUFFED NSTRL (MISCELLANEOUS) ×3 IMPLANT
SCOPETTES 8  STERILE (MISCELLANEOUS)
SCOPETTES 8 STERILE (MISCELLANEOUS) IMPLANT
SET BERKELEY SUCTION TUBING (SUCTIONS) IMPLANT
SYR CONTROL 10ML LL (SYRINGE) ×3 IMPLANT
TOP DISP BERKELEY (MISCELLANEOUS) IMPLANT
TOWEL OR 17X24 6PK STRL BLUE (TOWEL DISPOSABLE) ×6 IMPLANT
TRAY DSU PREP LF (CUSTOM PROCEDURE TRAY) ×3 IMPLANT
TUBE CONNECTING 12'X1/4 (SUCTIONS)
TUBE CONNECTING 12X1/4 (SUCTIONS) IMPLANT
TUBING AQUILEX INFLOW (TUBING) ×3 IMPLANT
TUBING AQUILEX OUTFLOW (TUBING) ×3 IMPLANT
VACURETTE 10 RIGID CVD (CANNULA) IMPLANT
VACURETTE 7MM CVD STRL WRAP (CANNULA) IMPLANT
VACURETTE 8 RIGID CVD (CANNULA) IMPLANT
VACURETTE 9 RIGID CVD (CANNULA) IMPLANT

## 2016-11-16 NOTE — H&P (View-Only) (Signed)
Consult Note: Gyn-Onc   Sue Dickson 56 y.o. female  Chief Complaint  Patient presents with  . Endometrial mass N94.89]    Assessment : Postmenopausal bleeding and uterine mass on certain etiology.  Plan:We will proceed with a ultrasound-guided D&C and possible hysteroscopy next Wednesday. Her questions were elicited in answer to their satisfaction. They know that we will call them with the results and determine next steps pending them.  HPI: 24 year old white married female gravida 3 para 2 seen in consultation request of Dr.Ozan regarding management of a newly diagnosed uterine mass. The patient presented with postmenopausal bleeding approximate 14 months after undergoing menopause. An transvaginal ultrasound revealed the uterus to measure 10 x 7 x 6.2 cm. The endometrium was thickened at 4.5 cm with a solid mass noted within the endometrial canal measuring 4.6 x 4.2 x 3.9 cm. The ovaries appeared normal. There is no free fluid. The mass was to the fine as having significant internal blood flow. Endometrial biopsy was performed showing proliferative endometrium but no hyperplasia or malignancy.  Reproductive: Uterus is notable for a 4.8 x 3.4 x 3.7 cm enhancing mass in the uterine fundus (series 11/image 18). Mildly irregular margins. Thinning of the overlying uterine fundus with possible myometrial invasion. Overall, this appearance favors endometrial carcinoma, with endometrial polyp and degenerating intracavitary fibroid considered less likely.  If this reflects carcinoma, this would likely reflect stage IB disease. No involvement of the cervix or adjacent structures.  IMPRESSION: 4.8 cm enhancing mass in the uterine fundus, worrisome for endometrial carcinoma (stage IB) in the setting of postmenopausal bleeding.  Greater than 20 minutes face to face time with the patient and her husband reviewing the MRI results as well as next steps. She is scheduled for a D&C next Wednesday with me.  They had a lot of questions about the possibilities and I discussed with them that I cannot offer more information and we'll was provided in the MRI that this could either be an endometrial cancer, a fibroid or polyp. I discussed with them that we would try to remove his metastasis of the mass as possible. If it is an invasive endometrial cancer would not be able to remove the entire cancer as it does involve 12 the myometrium. The questions about what next steps would be involved but there was an endometrial cancer and I briefly discussed that it would be a hysterectomy and we did discuss the role of robotic and minimally invasive surgery. They then got into some details are really too premature to discuss such as the role of chemotherapy and radiation. I discussed with them that I be happy to answer this question should the need arise business really focus on getting a diagnosis.  They are comfortable with [place] moving forward with a D&C on Wednesday. They know that we will call them with the results and determine her disposition pending them.  No Known Allergies  Past Medical History:  Diagnosis Date  . Diabetes mellitus without complication ([place])    Recently diagnosed 10-28-16. Curently diet controled  . Factor 5 Leiden mutation, heterozygous [facility])     History reviewed. No pertinent surgical history.  Current Outpatient Prescriptions  Medication Sig Dispense Refill  . ALPRAZolam (XANAX) 0.25 MG tablet Take 0.25 mg by mouth daily as needed for anxiety.    Marland Kitchen aspirin EC 81 MG tablet Take 81 mg by mouth daily.    Marland Kitchen buPROPion (WELLBUTRIN XL) 150 MG 24 hr tablet TAKE 1 TABLET BY MOUTH EVERY DAY  IN THE MORNING  5  . cyclobenzaprine (FLEXERIL) 10 MG tablet Take 10 mg by mouth 3 (three) times daily as needed.  0  . esomeprazole (NEXIUM) 20 MG capsule Take 20 mg by mouth every morning.     No current facility-administered medications for this visit.     Social History   Social History  . Marital  status: Married    Spouse name: N/A  . Number of children: N/A  . Years of education: N/A   Occupational History  . Not on file.   Social History Main Topics  . Smoking status: Never Smoker  . Smokeless tobacco: Never Used  . Alcohol use No  . Drug use: No  . Sexual activity: Not on file   Other Topics Concern  . Not on file   Social History Narrative  . No narrative on file    Family History  Problem Relation Age of Onset  . Uterine cancer Maternal Aunt     Vitals: Blood pressure (!) 150/100, pulse (!) 108, temperature 98 F (36.7 C), temperature source Oral, resp. rate 18, weight 188 lb (85.3 kg), SpO2 100 %.  Physical Exam: General : The patient is a healthy woman in no acute distress.   Sterling Ucci A., MD 11/09/2016, 2:13 PM

## 2016-11-16 NOTE — Anesthesia Postprocedure Evaluation (Signed)
Anesthesia Post Note  Patient: Sue Dickson  Procedure(s) Performed: Procedure(s) (LRB): DILATATION AND CURETTAGE OF UTERUS UNDER ULTRASOUND GUIDANCE, HYSTEROSCOPY (N/A)     Patient location during evaluation: PACU Anesthesia Type: General Level of consciousness: sedated Pain management: pain level controlled Vital Signs Assessment: post-procedure vital signs reviewed and stable Respiratory status: spontaneous breathing and respiratory function stable Cardiovascular status: stable Postop Assessment: no apparent nausea or vomiting Anesthetic complications: no    Last Vitals:  Vitals:   11/16/16 0930 11/16/16 0945  BP: (!) 145/97 (!) 154/103  Pulse: 87 85  Resp: 17 17  Temp:    SpO2: 96% 98%    Last Pain:  Vitals:   11/16/16 0700  TempSrc: Oral                 [name]

## 2016-11-16 NOTE — Transfer of Care (Signed)
Immediate Anesthesia Transfer of Care Note  Patient: Sue Dickson  Procedure(s) Performed: Procedure(s): DILATATION AND CURETTAGE OF UTERUS UNDER ULTRASOUND GUIDANCE, HYSTEROSCOPY (N/A)  Patient Location: PACU  Anesthesia Type:General  Level of Consciousness: awake, alert  and oriented  Airway & Oxygen Therapy: Patient Spontanous Breathing and Patient connected to nasal cannula oxygen  Post-op Assessment: Report given to RN  Post vital signs: Reviewed and stable  Last Vitals: 148/97, 102, 16, 100%, 97.6 Vitals:   11/16/16 0700  BP: (!) 156/96  Pulse: (!) 115  Resp: 16  Temp: 37 C  SpO2: 99%    Last Pain:  Vitals:   11/16/16 0700  TempSrc: Oral      Patients Stated Pain Goal: 5 (17/49/44 9675)  Complications: No apparent anesthesia complications

## 2016-11-16 NOTE — Anesthesia Procedure Notes (Signed)
Procedure Name: LMA Insertion Date/Time: 11/16/2016 8:24 AM Performed by: Bethena Roys T Pre-anesthesia Checklist: Patient identified, Emergency Drugs available, Suction available and Patient being monitored Patient Re-evaluated:Patient Re-evaluated prior to induction Oxygen Delivery Method: Circle system utilized Preoxygenation: Pre-oxygenation with 100% oxygen Induction Type: IV induction Ventilation: Mask ventilation without difficulty LMA: LMA inserted LMA Size: 4.0 Number of attempts: 1 Airway Equipment and Method: Bite block Placement Confirmation: positive ETCO2 Tube secured with: Tape Dental Injury: Teeth and Oropharynx as per pre-operative assessment

## 2016-11-16 NOTE — Discharge Instructions (Signed)

## 2016-11-16 NOTE — Interval H&P Note (Signed)
History and Physical Interval Note:  11/16/2016 8:13 AM  Sue Dickson  has presented today for surgery, with the diagnosis of UTERINE MASS  The various methods of treatment have been discussed with the patient and family. After consideration of risks, benefits and other options for treatment, the patient has consented to  Procedure(s): DILATATION AND CURETTAGE (N/A) as a surgical intervention .  The patient's history has been reviewed, patient examined, no change in status, stable for surgery.  I have reviewed the patient's chart and labs.  Questions were answered to the patient's satisfaction.     [place] A.

## 2016-11-16 NOTE — Op Note (Signed)
PATIENT: Zyion Leidner DATE OF BIRTH: [birth year] ENCOUNTER DATE: 11/15/16   Preop Diagnosis: Uterine mass  Postoperative Diagnosis: Same, probable submucosal myoma  Surgery: D&C, hysteroscopy, biopsy of uterine mass  Surgeons:  Imagene Gurney A. Alycia Rossetti, MD  Anesthesia: General   Estimated blood loss:75 ml  IVF: 700  ml   Urine output: 30 ml, fluid deficit 527 mL  Complications: None   Pathology: Uterine curettings, mass biopsy  Operative findings:   Procedure: The patient was identified in the preoperative holding area. Informed consent was signed on the chart. Patient was seen history was reviewed and exam was performed.   The patient was then taken to the operating room and placed in the supine position with SCD hose on. General anesthesia was then induced without difficulty. She was then placed in the dorsolithotomy position.   She was prepped with Betadine and I of 30 miles of urine was performed. Timeout was performed after she was draped. 7 cc of 1% lidocaine with epi was injected for paracervical block. The cervix was grasped with single-tooth tenaculum and dilated to 23 without difficulty. Using ultrasound guidance and an D&C was performed. There was a gritty texture throughout the uterine cavity from approximately 1:00 to 4:00 in a counterclockwise fashion. There was a sense of a mass that the curette would pass over. This was documented on ultrasound. Using a nasal polyp forcep the mass was able to be grasped and several pieces of the mass were removed for pathology.  As we were not able to completely evaluate the endometrial cavity hysteroscopy was performed in the usual fashion. A pedunculated mass appeared to be a fibroid was seen arising from the left lateral fundal portion of the uterus. It was on a wide stock measuring approximately 1 cm. The mass appeared to be approximate 4 cm. The entire endometrium was quite atrophic in appearance. The mass is well-circumscribed. An  attempt was made to grab it with a ring forcep. It was too large to be removed completely.  At this time as we do feel that we obtain some biopsies and it appeared to be a fibroid the procedure was aborted.  She tolerated procedure well. She was given Toradol postoperatively. This is Nancy Marus  dictating an operative note on [employer].  All [employer] and instrument counts were correct 2.

## 2016-11-17 ENCOUNTER — Telehealth: Payer: Self-pay

## 2016-11-17 ENCOUNTER — Encounter (HOSPITAL_BASED_OUTPATIENT_CLINIC_OR_DEPARTMENT_OTHER): Payer: Self-pay | Admitting: Gynecologic Oncology

## 2016-11-17 NOTE — Telephone Encounter (Signed)
Pt LM stating that she itching was prior to her taking the percocet.  She has used the benadryl with good effect. Called pt back and requested she call back to the office if she has a rash with the itching to discuss further.

## 2016-11-17 NOTE — Telephone Encounter (Signed)
LM for Sue Dickson that she can use benadryl 25-50 mg for the itching per Melissa Cross,NP. Requested she call back to see if she has used the Oxycodone and if she has a rash.

## 2016-11-18 ENCOUNTER — Telehealth: Payer: Self-pay | Admitting: Gynecologic Oncology

## 2016-11-18 NOTE — Telephone Encounter (Signed)
Informed patient of path results.  She is going to call next week if she wants to see Dr. Denman George in [place] or go to [facility] with Dr. Alycia Rossetti.  All questions answered.

## 2016-11-21 ENCOUNTER — Telehealth: Payer: Self-pay | Admitting: Gynecologic Oncology

## 2016-11-21 NOTE — Telephone Encounter (Signed)
Patient returned call to the office.  She states she would like to proceed with scheduling a hysterectomy.  She is going to call her insurance to see if it would be covered at [facility].  I left a message for Katharine Look at [facility] to inquire about OR availability with Dr. Alycia Rossetti.

## 2016-11-22 ENCOUNTER — Telehealth: Payer: Self-pay | Admitting: Gynecologic Oncology

## 2016-11-22 NOTE — Telephone Encounter (Signed)
Left message for patient with Dr. Elenora Gamma availability at [facility] on Oct 5 or Oct 12.  Advised her to call the office if she would like to proceed.

## 2016-11-30 ENCOUNTER — Encounter: Payer: Self-pay | Admitting: Gynecologic Oncology

## 2016-11-30 ENCOUNTER — Ambulatory Visit: Payer: 59 | Attending: Gynecologic Oncology | Admitting: Gynecologic Oncology

## 2016-11-30 VITALS — BP 157/98 | HR 115 | Temp 98.2°F | Resp 18

## 2016-11-30 DIAGNOSIS — E119 Type 2 diabetes mellitus without complications: Secondary | ICD-10-CM | POA: Insufficient documentation

## 2016-11-30 DIAGNOSIS — Z79899 Other long term (current) drug therapy: Secondary | ICD-10-CM | POA: Insufficient documentation

## 2016-11-30 DIAGNOSIS — Z79891 Long term (current) use of opiate analgesic: Secondary | ICD-10-CM | POA: Insufficient documentation

## 2016-11-30 DIAGNOSIS — K219 Gastro-esophageal reflux disease without esophagitis: Secondary | ICD-10-CM | POA: Diagnosis not present

## 2016-11-30 DIAGNOSIS — N950 Postmenopausal bleeding: Secondary | ICD-10-CM | POA: Diagnosis not present

## 2016-11-30 DIAGNOSIS — Z7982 Long term (current) use of aspirin: Secondary | ICD-10-CM | POA: Diagnosis not present

## 2016-11-30 DIAGNOSIS — N858 Other specified noninflammatory disorders of uterus: Secondary | ICD-10-CM

## 2016-11-30 DIAGNOSIS — N859 Noninflammatory disorder of uterus, unspecified: Secondary | ICD-10-CM | POA: Diagnosis not present

## 2016-11-30 NOTE — Patient Instructions (Signed)
Preparing for your Surgery  Plan for surgery on January 03, 2017 with Dr. Nancy Marus at [place] will be scheduled for a robotic hysterectomy, bilateral salpingectomy, possible oophorectomy if cancer identified.  Pre-operative Testing -You will receive a phone call from presurgical testing at [facility] to arrange for a pre-operative testing appointment before your surgery.  This appointment normally occurs one to two weeks before your scheduled surgery.   -Bring your insurance card, copy of an advanced directive if applicable, medication list  -At that visit, you will be asked to sign a consent for a possible blood transfusion in case a transfusion becomes necessary during surgery.  The need for a blood transfusion is rare but having consent is a necessary part of your care.     -You should not be taking blood thinners or aspirin at least ten days prior to surgery unless instructed by your surgeon.  Day Before Surgery at [place] will be asked to take in a light diet the day before surgery.  Avoid carbonated beverages.  You will be advised to have nothing to eat or drink after midnight the evening before.    Eat a light diet the day before surgery.  Examples including soups, broths, toast, yogurt, mashed potatoes.  Things to avoid include carbonated beverages (fizzy beverages), raw fruits and raw vegetables, or beans.   If your bowels are filled with gas, your surgeon will have difficulty visualizing your pelvic organs which increases your surgical risks.  Your role in recovery Your role is to become active as soon as directed by your doctor, while still giving yourself time to heal.  Rest when you feel tired. You will be asked to do the following in order to speed your recovery:  - Cough and breathe deeply. This helps toclear and expand your lungs and can prevent pneumonia. You may be given a spirometer to practice deep breathing. A staff member  will show you how to use the spirometer. - Do mild physical activity. Walking or moving your legs help your circulation and body functions return to normal. A staff member will help you when you try to walk and will provide you with simple exercises. Do not try to get up or walk alone the first time. - Actively manage your pain. Managing your pain lets you move in comfort. We will ask you to rate your pain on a scale of zero to 10. It is your responsibility to tell your doctor or nurse where and how much you hurt so your pain can be treated.  Special Considerations -If you are diabetic, you may be placed on insulin after surgery to have closer control over your blood sugars to promote healing and recovery.  This does not mean that you will be discharged on insulin.  If applicable, your oral antidiabetics will be resumed when you are tolerating a solid diet.  -Your final pathology results from surgery should be available by the Friday after surgery and the results will be relayed to you when available.  -Dr. Lahoma Crocker is the Surgeon that assists your GYN Oncologist with surgery.  The next day after your surgery you will either see your GYN Oncologist or Dr. Lahoma Crocker.   Blood Transfusion Information WHAT IS A BLOOD TRANSFUSION? A transfusion is the replacement of blood or some of its parts. Blood is made up of multiple cells which provide different functions.  Red blood cells carry oxygen and are used for blood loss  replacement.  White blood cells fight against infection.  Platelets control bleeding.  Plasma helps clot blood.  Other blood products are available for specialized needs, such as hemophilia or other clotting disorders. BEFORE THE TRANSFUSION  Who gives blood for transfusions?   You may be able to donate blood to be used at a later date on yourself (autologous donation).  Relatives can be asked to donate blood. This is generally not any safer than if you have  received blood from a stranger. The same precautions are taken to ensure safety when a relative's blood is donated.  Healthy volunteers who are fully evaluated to make sure their blood is safe. This is blood bank blood. Transfusion therapy is the safest it has ever been in the practice of medicine. Before blood is taken from a donor, a complete history is taken to make sure that person has no history of diseases nor engages in risky social behavior (examples are intravenous drug use or sexual activity with multiple partners). The donor's travel history is screened to minimize risk of transmitting infections, such as malaria. The donated blood is tested for signs of infectious diseases, such as HIV and hepatitis. The blood is then tested to be sure it is compatible with you in order to minimize the chance of a transfusion reaction. If you or a relative donates blood, this is often done in anticipation of surgery and is not appropriate for emergency situations. It takes many days to process the donated blood. RISKS AND COMPLICATIONS Although transfusion therapy is very safe and saves many lives, the main dangers of transfusion include:   Getting an infectious disease.  Developing a transfusion reaction. This is an allergic reaction to something in the blood you were given. Every precaution is taken to prevent this. The decision to have a blood transfusion has been considered carefully by your caregiver before blood is given. Blood is not given unless the benefits outweigh the risks.

## 2016-11-30 NOTE — Progress Notes (Signed)
Consult Note: Gyn-Onc   Sue Dickson 56 y.o. female  Chief Complaint  Patient presents with  . Uterine mass    Assessment : Postmenopausal bleeding and uterine mass on certain etiology. I believe this most likely represents an intracavitary myoma and does not represent endometrial cancer. I had a lengthy conversation with the patient and her husband. Greater than 40 minutes face to face time was spent with them answered all of her questions. I discussed that we can proceed with interval follow-up and I would recommend an MRI in 3 months. We'll also discussed proceeding with surgery for definitive diagnosis. After our discussion they wish to proceed with surgery as they want to know definitively what this mass is. We will proceed with a robotic hysterectomy bilateral salpingectomy. The mass will be sent for frozen section with the remainder of the uterus. If it is benign she would like to retain her ovaries even though she is having menopausal symptoms. This malignant she understands will proceed with oophorectomy me and we may proceed with additional staging depending on whether this is an epithelial versus a mesenchymal tumor.  She continues to have vasomotor symptoms and is currently on bupropion. She's not sure this is contributing to what she states as tachycardia or her elevated blood pressures. She will discuss this with her primary care physician. She was a little worried that her preoperative glucose a day of surgery was 175. She has had some elevated blood sugars in the past and was told that some point she may need to be on medication for this.  She will follow-up with her primary care physician regarding her medical issues. She would like to move forward with surgery and she is tentatively scheduled for surgery for November 6.  Risks and benefits of surgery including bleeding, infection, injury to surrounding organs was discussed with the patient and her husband. They know to contact me  if they have any other questions prior to that time.  HPI: 56 year old white married female gravida 3 para 2 seen in consultation request of Dr.Ozan regarding management of a newly diagnosed uterine mass. The patient presented with postmenopausal bleeding approximate 14 months after undergoing menopause. An transvaginal ultrasound revealed the uterus to measure 10 x 7 x 6.2 cm. The endometrium was thickened at 4.5 cm with a solid mass noted within the endometrial canal measuring 4.6 x 4.2 x 3.9 cm. The ovaries appeared normal. There is no free fluid. The mass was to the fine as having significant internal blood flow. Endometrial biopsy was performed showing proliferative endometrium but no hyperplasia or malignancy.  Reproductive: Uterus is notable for a 4.8 x 3.4 x 3.7 cm enhancing mass in the uterine fundus (series 11/image 18). Mildly irregular margins. Thinning of the overlying uterine fundus with possible myometrial invasion. Overall, this appearance favors endometrial carcinoma, with endometrial polyp and degenerating intracavitary fibroid considered less likely.  If this reflects carcinoma, this would likely reflect stage IB disease. No involvement of the cervix or adjacent structures.  IMPRESSION: 4.8 cm enhancing mass in the uterine fundus, worrisome for endometrial carcinoma (stage IB) in the setting of postmenopausal bleeding.  Greater than 20 minutes face to face time with the patient and her husband reviewing the MRI results as well as next steps. She is scheduled for a D&C next Wednesday with me. They had a lot of questions about the possibilities and I discussed with them that I cannot offer more information and we'll was provided in the MRI that this could  either be an endometrial cancer, a fibroid or polyp. I discussed with them that we would try to remove his metastasis of the mass as possible. If it is an invasive endometrial cancer would not be able to remove the entire cancer as it  does involve 12 the myometrium. The questions about what next steps would be involved but there was an endometrial cancer and I briefly discussed that it would be a hysterectomy and we did discuss the role of robotic and minimally invasive surgery. They then got into some details are really too premature to discuss such as the role of chemotherapy and radiation. I discussed with them that I be happy to answer this question should the need arise business really focus on getting a diagnosis.  Interval History: ENCOUNTER DATE: 11/15/16 Surgery: D&C, hysteroscopy, biopsy of uterine mass A pedunculated mass appeared to be a fibroid was seen arising from the left lateral fundal portion of the uterus. It was on a wide stock measuring approximately 1 cm. The mass appeared to be approximate 4 cm. The entire endometrium was quite atrophic in appearance. The mass is well-circumscribed.   Diagnosis Endometrium, curettage - FRAGMENTED, INACTIVE ENDOMETRIAL GLANDS, BENIGN MYOMETRIUM AND ABUNDANT BLOOD. - SEE MICROSCOPIC DESCRIPTION. Microscopic Comment There is abundant blood with scattered admixed fragments of inactive endometrial type glands with focal glandular crowding. While hyperplasia cannot be ruled out, these glands do not show significant cytologic atypia or evidence of malignancy. There are also fragments of benign myometrium present.  Not on File  Past Medical History:  Diagnosis Date  . Diabetes mellitus type 2, diet-controlled ([place])    new dx 10-28-2016  . Exercise-induced asthma   . Factor 5 Leiden mutation, heterozygous ([place])   . GERD (gastroesophageal reflux disease)   . Uterine mass     Past Surgical History:  Procedure Laterality Date  . CESAREAN SECTION  1999   and Bilateral Tubal Ligation  . COLONOSCOPY  2016  . DILATION AND CURETTAGE OF UTERUS  1996   w/ suction due missed ab  . DILATION AND CURETTAGE OF UTERUS N/A 11/16/2016   Procedure: DILATATION AND CURETTAGE OF UTERUS  UNDER ULTRASOUND GUIDANCE, HYSTEROSCOPY;  Surgeon: Nancy Marus, MD;  Location: [facility];  Service: Gynecology;  Laterality: N/A;  . WISDOM TOOTH EXTRACTION  age  37    Current Outpatient Prescriptions  Medication Sig Dispense Refill  . ALBUTEROL IN Inhale into the lungs as needed.    . ALPRAZolam (XANAX) 0.25 MG tablet Take 0.25 mg by mouth daily as needed for anxiety.    Marland Kitchen aspirin EC 81 MG tablet Take 81 mg by mouth every evening.     Marland Kitchen buPROPion (WELLBUTRIN XL) 150 MG 24 hr tablet TAKE 1 TABLET BY MOUTH EVERY DAY IN THE MORNING  5  . cetirizine (ZYRTEC) 5 MG tablet Take 5 mg by mouth daily. 1-2 tablets prn.    . esomeprazole (NEXIUM) 20 MG capsule Take 20 mg by mouth every morning.     Marland Kitchen ibuprofen (ADVIL,MOTRIN) 200 MG tablet Take 200 mg by mouth every 6 (six) hours as needed.    . Multiple Vitamins-Minerals (CENTRUM SILVER 50+WOMEN PO) Take 1 tablet by mouth daily.    Marland Kitchen oxyCODONE-acetaminophen (PERCOCET/ROXICET) 5-325 MG tablet Take 1 tablet by mouth every 6 (six) hours as needed for severe pain. 30 tablet 0   No current facility-administered medications for this visit.     Social History   Social History  . Marital status: Married  Spouse name: N/A  . Number of children: N/A  . Years of education: N/A   Occupational History  . Not on file.   Social History Main Topics  . Smoking status: Never Smoker  . Smokeless tobacco: Never Used  . Alcohol use No  . Drug use: No  . Sexual activity: Not on file   Other Topics Concern  . Not on file   Social History Narrative  . No narrative on file    Family History  Problem Relation Age of Onset  . Uterine cancer Maternal Aunt     Vitals: Blood pressure (!) 150/100, pulse (!) 108, temperature 98 F (36.7 C), temperature source Oral, resp. rate 18, weight 188 lb (85.3 kg), SpO2 100 %.  Physical Exam: General : The patient is a healthy woman in no acute distress.   Deavin Forst A., MD 11/30/2016, 2:45  PM

## 2016-12-06 ENCOUNTER — Ambulatory Visit: Payer: 59 | Admitting: Gynecologic Oncology

## 2016-12-13 DIAGNOSIS — L720 Epidermal cyst: Secondary | ICD-10-CM | POA: Diagnosis not present

## 2016-12-13 DIAGNOSIS — I10 Essential (primary) hypertension: Secondary | ICD-10-CM | POA: Diagnosis not present

## 2016-12-13 DIAGNOSIS — R238 Other skin changes: Secondary | ICD-10-CM | POA: Diagnosis not present

## 2016-12-27 NOTE — Patient Instructions (Signed)
Sue Dickson  12/27/2016   Your procedure is scheduled on: 01-03-17  Report to [facility] Main  Entrance Take [place]  elevators to 3rd floor to  [place] at 830AM.   Call this number if you have problems the morning of surgery [phone]   Remember: ONLY 1 PERSON MAY GO WITH YOU TO SHORT STAY TO GET  READY MORNING OF [place].      Take these medicines the morning of surgery with A SIP OF WATER: bupropion(wellbutrin), esomeprazole(nexium), xanax if needed, inhaler if needed (may bring to hospital)                                 You may not have any metal on your body including hair pins and              piercings  Do not wear jewelry, make-up, lotions, powders or perfumes, deodorant             Do not wear nail polish.  Do not shave  48 hours prior to surgery.      Do not bring valuables to the hospital. Black.  Contacts, dentures or bridgework may not be worn into surgery.  Leave suitcase in the car. After surgery it may be brought to your room.                 Please read over the following fact sheets you were given: _____________________________________________________________________            Eat a light diet the day before surgery.  Examples including soups, broths, toast, yogurt, mashed potatoes.  Things to avoid include carbonated beverages (fizzy beverages), raw fruits and raw vegetables, or beans. If your bowels are filled with gas, your surgeon will have difficulty visualizing your pelvic organs which increases your surgical risks.  Do not eat food or drink liquids :After Midnight.    [facility] - Preparing for Surgery Before surgery, you can play an important role.  Because skin is not sterile, your skin needs to be as free of germs as possible.  You can reduce the number of germs on your skin by washing with CHG (chlorahexidine gluconate) soap before surgery.  CHG is an  antiseptic cleaner which kills germs and bonds with the skin to continue killing germs even after washing. Please DO NOT use if you have an allergy to CHG or antibacterial soaps.  If your skin becomes reddened/irritated stop using the CHG and inform your nurse when you arrive at Short Stay. Do not shave (including legs and underarms) for at least 48 hours prior to the first CHG shower.  You may shave your face/neck. Please follow these instructions carefully:  1.  Shower with CHG Soap the night before surgery and the  morning of Surgery.  2.  If you choose to wash your hair, wash your hair first as usual with your  normal  shampoo.  3.  After you shampoo, rinse your hair and body thoroughly to remove the  shampoo.                           4.  Use CHG as you would any  other liquid soap.  You can apply chg directly  to the skin and wash                       Gently with a scrungie or clean washcloth.  5.  Apply the CHG Soap to your body ONLY FROM THE NECK DOWN.   Do not use on face/ open                           Wound or open sores. Avoid contact with eyes, ears mouth and genitals (private parts).                       Wash face,  Genitals (private parts) with your normal soap.             6.  Wash thoroughly, paying special attention to the area where your surgery  will be performed.  7.  Thoroughly rinse your body with warm water from the neck down.  8.  DO NOT shower/wash with your normal soap after using and rinsing off  the CHG Soap.                9.  Pat yourself dry with a clean towel.            10.  Wear clean pajamas.            11.  Place clean sheets on your bed the night of your first shower and do not  sleep with pets. Day of Surgery : Do not apply any lotions/deodorants the morning of surgery.  Please wear clean clothes to the hospital/surgery center.  FAILURE TO FOLLOW THESE INSTRUCTIONS MAY RESULT IN THE CANCELLATION OF YOUR SURGERY PATIENT  SIGNATURE_________________________________  NURSE SIGNATURE__________________________________  ________________________________________________________________________ How to Manage Your Diabetes Before and After Surgery  Why is it important to control my blood sugar before and after surgery? . Improving blood sugar levels before and after surgery helps healing and can limit problems. . A way of improving blood sugar control is eating a healthy diet by: o  Eating less sugar and carbohydrates o  Increasing activity/exercise o  Talking with your doctor about reaching your blood sugar goals . High blood sugars (greater than 180 mg/dL) can raise your risk of infections and slow your recovery, so you will need to focus on controlling your diabetes during the weeks before surgery. . Make sure that the doctor who takes care of your diabetes knows about your planned surgery including the date and location.  How do I manage my blood sugar before surgery? . Check your blood sugar at least 4 times a day, starting 2 days before surgery, to make sure that the level is not too high or low. o Check your blood sugar the morning of your surgery when you wake up and every 2 hours until you get to the Short Stay unit. . If your blood sugar is less than 70 mg/dL, you will need to treat for low blood sugar: o Do not take insulin. o Treat a low blood sugar (less than 70 mg/dL) with  cup of clear juice (cranberry or apple), 4 glucose tablets, OR glucose gel. o Recheck blood sugar in 15 minutes after treatment (to make sure it is greater than 70 mg/dL). If your blood sugar is not greater than 70 mg/dL on recheck, call [phone] for further instructions. . Report your blood sugar  to the short stay nurse when you get to Short Stay.  . If you are admitted to the hospital after surgery: o Your blood sugar will be checked by the staff and you will probably be given insulin after surgery (instead of oral diabetes  medicines) to make sure you have good blood sugar levels. o The goal for blood sugar control after surgery is 80-180 mg/dL.    Patient Signature:  Date:   Nurse Signature:  Date:   Reviewed and Endorsed by [facility] Patient Education Committee, August 2015    Incentive Spirometer  An incentive spirometer is a tool that can help keep your lungs clear and active. This tool measures how well you are filling your lungs with each breath. Taking long deep breaths may help reverse or decrease the chance of developing breathing (pulmonary) problems (especially infection) following:  A long period of time when you are unable to move or be active. BEFORE THE PROCEDURE   If the spirometer includes an indicator to show your best effort, your nurse or respiratory therapist will set it to a desired goal.  If possible, sit up straight or lean slightly forward. Try not to slouch.  Hold the incentive spirometer in an upright position. INSTRUCTIONS FOR USE  1. Sit on the edge of your bed if possible, or sit up as far as you can in bed or on a chair. 2. Hold the incentive spirometer in an upright position. 3. Breathe out normally. 4. Place the mouthpiece in your mouth and seal your lips tightly around it. 5. Breathe in slowly and as deeply as possible, raising the piston or the ball toward the top of the column. 6. Hold your breath for 3-5 seconds or for as long as possible. Allow the piston or ball to fall to the bottom of the column. 7. Remove the mouthpiece from your mouth and breathe out normally. 8. Rest for a few seconds and repeat Steps 1 through 7 at least 10 times every 1-2 hours when you are awake. Take your time and take a few normal breaths between deep breaths. 9. The spirometer may include an indicator to show your best effort. Use the indicator as a goal to work toward during each repetition. 10. After each set of 10 deep breaths, practice coughing to be sure your lungs are clear. If  you have an incision (the cut made at the time of surgery), support your incision when coughing by placing a pillow or rolled up towels firmly against it. Once you are able to get out of bed, walk around indoors and cough well. You may stop using the incentive spirometer when instructed by your caregiver.  RISKS AND COMPLICATIONS  Take your time so you do not get dizzy or light-headed.  If you are in pain, you may need to take or ask for pain medication before doing incentive spirometry. It is harder to take a deep breath if you are having pain. AFTER USE  Rest and breathe slowly and easily.  It can be helpful to keep track of a log of your progress. Your caregiver can provide you with a simple table to help with this. If you are using the spirometer at home, follow these instructions: [place] IF:   You are having difficultly using the spirometer.  You have trouble using the spirometer as often as instructed.  Your pain medication is not giving enough relief while using the spirometer.  You develop fever of 100.5 F (38.1  C) or higher. SEEK IMMEDIATE MEDICAL CARE IF:   You cough up bloody sputum that had not been present before.  You develop fever of 102 F (38.9 C) or greater.  You develop worsening pain at or near the incision site. MAKE SURE YOU:   Understand these instructions.  Will watch your condition.  Will get help right away if you are not doing well or get worse. Document Released: 06/27/2006 Document Revised: 05/09/2011 Document Reviewed: 08/28/2006 Exit[employer] Patient Information 2014 Exit[employer], [place].   ________________________________________________________________________  WHAT IS A BLOOD TRANSFUSION? Blood Transfusion Information  A transfusion is the replacement of blood or some of its parts. Blood is made up of multiple cells which provide different functions.  Red blood cells carry oxygen and are used for blood loss replacement.  White  blood cells fight against infection.  Platelets control bleeding.  Plasma helps clot blood.  Other blood products are available for specialized needs, such as hemophilia or other clotting disorders. BEFORE THE TRANSFUSION  Who gives blood for transfusions?   Healthy volunteers who are fully evaluated to make sure their blood is safe. This is blood bank blood. Transfusion therapy is the safest it has ever been in the practice of medicine. Before blood is taken from a donor, a complete history is taken to make sure that person has no history of diseases nor engages in risky social behavior (examples are intravenous drug use or sexual activity with multiple partners). The donor's travel history is screened to minimize risk of transmitting infections, such as malaria. The donated blood is tested for signs of infectious diseases, such as HIV and hepatitis. The blood is then tested to be sure it is compatible with you in order to minimize the chance of a transfusion reaction. If you or a relative donates blood, this is often done in anticipation of surgery and is not appropriate for emergency situations. It takes many days to process the donated blood. RISKS AND COMPLICATIONS Although transfusion therapy is very safe and saves many lives, the main dangers of transfusion include:   Getting an infectious disease.  Developing a transfusion reaction. This is an allergic reaction to something in the blood you were given. Every precaution is taken to prevent this. The decision to have a blood transfusion has been considered carefully by your caregiver before blood is given. Blood is not given unless the benefits outweigh the risks. AFTER THE TRANSFUSION  Right after receiving a blood transfusion, you will usually feel much better and more energetic. This is especially true if your red blood cells have gotten low (anemic). The transfusion raises the level of the red blood cells which carry oxygen, and this  usually causes an energy increase.  The nurse administering the transfusion will monitor you carefully for complications. HOME CARE INSTRUCTIONS  No special instructions are needed after a transfusion. You may find your energy is better. Speak with your caregiver about any limitations on activity for underlying diseases you may have. SEEK MEDICAL CARE IF:   Your condition is not improving after your transfusion.  You develop redness or irritation at the intravenous (IV) site. SEEK IMMEDIATE MEDICAL CARE IF:  Any of the following symptoms occur over the next 12 hours:  Shaking chills.  You have a temperature by mouth above 102 F (38.9 C), not controlled by medicine.  Chest, back, or muscle pain.  People around you feel you are not acting correctly or are confused.  Shortness of breath or difficulty breathing.  Dizziness  and fainting.  You get a rash or develop hives.  You have a decrease in urine output.  Your urine turns a dark color or changes to pink, red, or brown. Any of the following symptoms occur over the next 10 days:  You have a temperature by mouth above 102 F (38.9 C), not controlled by medicine.  Shortness of breath.  Weakness after normal activity.  The white part of the eye turns yellow (jaundice).  You have a decrease in the amount of urine or are urinating less often.  Your urine turns a dark color or changes to pink, red, or brown. Document Released: 02/12/2000 Document Revised: 05/09/2011 Document Reviewed: 10/01/2007 [facility] Patient Information 2014 [place], [place].  _______________________________________________________________________

## 2016-12-27 NOTE — Progress Notes (Signed)
EKG 11-16-16 epic

## 2016-12-28 ENCOUNTER — Encounter (HOSPITAL_COMMUNITY): Payer: Self-pay

## 2016-12-28 ENCOUNTER — Encounter (HOSPITAL_COMMUNITY)
Admission: RE | Admit: 2016-12-28 | Discharge: 2016-12-28 | Disposition: A | Discharge status: Home / Self Care | Payer: 59 | Source: Ambulatory Visit | Attending: Obstetrics & Gynecology | Admitting: Obstetrics & Gynecology

## 2016-12-28 DIAGNOSIS — N858 Other specified noninflammatory disorders of uterus: Secondary | ICD-10-CM | POA: Diagnosis not present

## 2016-12-28 DIAGNOSIS — E119 Type 2 diabetes mellitus without complications: Secondary | ICD-10-CM | POA: Insufficient documentation

## 2016-12-28 DIAGNOSIS — Z01818 Encounter for other preprocedural examination: Secondary | ICD-10-CM | POA: Diagnosis not present

## 2016-12-28 LAB — COMPREHENSIVE METABOLIC PANEL
ALT: 21 U/L (ref 14–54)
AST: 19 U/L (ref 15–41)
Albumin: 4.3 g/dL (ref 3.5–5.0)
Alkaline Phosphatase: 151 U/L — ABNORMAL HIGH (ref 38–126)
Anion gap: 9 (ref 5–15)
BILIRUBIN TOTAL: 0.8 mg/dL (ref 0.3–1.2)
BUN: 16 mg/dL (ref 6–20)
CO2: 27 mmol/L (ref 22–32)
Calcium: 9.5 mg/dL (ref 8.9–10.3)
Chloride: 102 mmol/L (ref 101–111)
Creatinine, Ser: 0.62 mg/dL (ref 0.44–1.00)
GFR calc Af Amer: >60 mL/min (ref >60)
Glucose, Bld: 192 mg/dL — ABNORMAL HIGH (ref 65–99)
POTASSIUM: 4.3 mmol/L (ref 3.5–5.1)
Sodium: 138 mmol/L (ref 135–145)
TOTAL PROTEIN: 7.8 g/dL (ref 6.5–8.1)

## 2016-12-28 LAB — URINALYSIS, ROUTINE W REFLEX MICROSCOPIC
Bilirubin Urine: NEGATIVE
GLUCOSE, UA: NEGATIVE mg/dL
HGB URINE DIPSTICK: NEGATIVE
Ketones, ur: NEGATIVE mg/dL
Leukocytes, UA: NEGATIVE
Nitrite: NEGATIVE
PROTEIN: NEGATIVE mg/dL
SPECIFIC GRAVITY, URINE: 1.003 — AB (ref 1.005–1.030)
pH: 7 (ref 5.0–8.0)

## 2016-12-28 LAB — PREGNANCY, URINE: Preg Test, Ur: NEGATIVE

## 2016-12-28 LAB — HEMOGLOBIN A1C
Hgb A1c MFr Bld: 7.6 % — ABNORMAL HIGH (ref 4.8–5.6)
MEAN PLASMA GLUCOSE: 171.42 mg/dL

## 2016-12-28 LAB — CBC
HEMATOCRIT: 43 % (ref 36.0–46.0)
HEMOGLOBIN: 14.7 g/dL (ref 12.0–15.0)
MCH: 28.6 pg (ref 26.0–34.0)
MCHC: 34.2 g/dL (ref 30.0–36.0)
MCV: 83.7 fL (ref 78.0–100.0)
Platelets: 264 10*3/uL (ref 150–400)
RBC: 5.14 MIL/uL — ABNORMAL HIGH (ref 3.87–5.11)
RDW: 13 % (ref 11.5–15.5)
WBC: 6.5 10*3/uL (ref 4.0–10.5)

## 2016-12-28 LAB — ABO/RH: ABO/RH(D): A NEG

## 2017-01-02 ENCOUNTER — Telehealth: Payer: Self-pay | Admitting: *Deleted

## 2017-01-02 NOTE — Telephone Encounter (Signed)
Returned patient's call regarding a sore throat and surgery tomorrow. Per Melissa APP patient advised she ok to have the surgery tomorrow.

## 2017-01-03 ENCOUNTER — Other Ambulatory Visit: Payer: Self-pay

## 2017-01-03 ENCOUNTER — Ambulatory Visit (HOSPITAL_COMMUNITY)
Admission: RE | Admit: 2017-01-03 | Discharge: 2017-01-04 | Disposition: A | Discharge status: Home / Self Care | Payer: 59 | Source: Ambulatory Visit | Attending: Gynecologic Oncology | Admitting: Gynecologic Oncology

## 2017-01-03 ENCOUNTER — Encounter (HOSPITAL_COMMUNITY)
Admission: RE | Disposition: A | Discharge status: Home / Self Care | Payer: Self-pay | Source: Ambulatory Visit | Attending: Gynecologic Oncology

## 2017-01-03 ENCOUNTER — Encounter (HOSPITAL_COMMUNITY): Payer: Self-pay | Admitting: *Deleted

## 2017-01-03 ENCOUNTER — Ambulatory Visit (HOSPITAL_COMMUNITY): Payer: 59 | Admitting: Anesthesiology

## 2017-01-03 DIAGNOSIS — K219 Gastro-esophageal reflux disease without esophagitis: Secondary | ICD-10-CM | POA: Insufficient documentation

## 2017-01-03 DIAGNOSIS — N950 Postmenopausal bleeding: Secondary | ICD-10-CM | POA: Insufficient documentation

## 2017-01-03 DIAGNOSIS — E119 Type 2 diabetes mellitus without complications: Secondary | ICD-10-CM | POA: Insufficient documentation

## 2017-01-03 DIAGNOSIS — N858 Other specified noninflammatory disorders of uterus: Secondary | ICD-10-CM

## 2017-01-03 DIAGNOSIS — Z79899 Other long term (current) drug therapy: Secondary | ICD-10-CM | POA: Insufficient documentation

## 2017-01-03 DIAGNOSIS — N838 Other noninflammatory disorders of ovary, fallopian tube and broad ligament: Secondary | ICD-10-CM | POA: Diagnosis not present

## 2017-01-03 DIAGNOSIS — D6851 Activated protein C resistance: Secondary | ICD-10-CM | POA: Insufficient documentation

## 2017-01-03 DIAGNOSIS — N736 Female pelvic peritoneal adhesions (postinfective): Secondary | ICD-10-CM | POA: Diagnosis not present

## 2017-01-03 DIAGNOSIS — D259 Leiomyoma of uterus, unspecified: Secondary | ICD-10-CM | POA: Diagnosis present

## 2017-01-03 DIAGNOSIS — J45909 Unspecified asthma, uncomplicated: Secondary | ICD-10-CM | POA: Diagnosis not present

## 2017-01-03 DIAGNOSIS — Z7982 Long term (current) use of aspirin: Secondary | ICD-10-CM | POA: Diagnosis not present

## 2017-01-03 DIAGNOSIS — Z23 Encounter for immunization: Secondary | ICD-10-CM | POA: Insufficient documentation

## 2017-01-03 HISTORY — PX: ROBOTIC ASSISTED TOTAL HYSTERECTOMY: SHX6085

## 2017-01-03 LAB — TYPE AND SCREEN
ABO/RH(D): A NEG
Antibody Screen: NEGATIVE

## 2017-01-03 LAB — GLUCOSE, CAPILLARY
GLUCOSE-CAPILLARY: 200 mg/dL — AB (ref 65–99)
GLUCOSE-CAPILLARY: 264 mg/dL — AB (ref 65–99)
GLUCOSE-CAPILLARY: 252 mg/dL — AB (ref 65–99)
Glucose-Capillary: 192 mg/dL — ABNORMAL HIGH (ref 65–99)

## 2017-01-03 SURGERY — HYSTERECTOMY, TOTAL, ROBOT-ASSISTED
Anesthesia: General | Laterality: Bilateral

## 2017-01-03 MED ORDER — LACTATED RINGERS IR SOLN
Status: DC | PRN
  Administered 2017-01-03: 1000 mL

## 2017-01-03 MED ORDER — LIDOCAINE 2% (20 MG/ML) 5 ML SYRINGE
INTRAMUSCULAR | Status: AC
  Filled 2017-01-03: qty 5

## 2017-01-03 MED ORDER — ASPIRIN EC 81 MG PO TBEC
81.0000 mg | DELAYED_RELEASE_TABLET | Freq: Every day | ORAL | Status: DC
  Administered 2017-01-03: 81 mg via ORAL
  Filled 2017-01-03: qty 1

## 2017-01-03 MED ORDER — DEXAMETHASONE SODIUM PHOSPHATE 10 MG/ML IJ SOLN
INTRAMUSCULAR | Status: DC | PRN
  Administered 2017-01-03: 10 mg via INTRAVENOUS

## 2017-01-03 MED ORDER — HYDROMORPHONE HCL 1 MG/ML IJ SOLN
INTRAMUSCULAR | Status: AC
  Filled 2017-01-03: qty 1

## 2017-01-03 MED ORDER — MIDAZOLAM HCL 2 MG/2ML IJ SOLN
INTRAMUSCULAR | Status: AC
  Filled 2017-01-03: qty 2

## 2017-01-03 MED ORDER — ROCURONIUM BROMIDE 50 MG/5ML IV SOSY
PREFILLED_SYRINGE | INTRAVENOUS | Status: DC | PRN
  Administered 2017-01-03: 20 mg via INTRAVENOUS
  Administered 2017-01-03: 50 mg via INTRAVENOUS

## 2017-01-03 MED ORDER — KCL IN DEXTROSE-NACL 20-5-0.45 MEQ/L-%-% IV SOLN
INTRAVENOUS | Status: DC
  Administered 2017-01-03: 50 mL/h via INTRAVENOUS
  Filled 2017-01-03: qty 1000

## 2017-01-03 MED ORDER — SCOPOLAMINE 1 MG/3DAYS TD PT72
MEDICATED_PATCH | TRANSDERMAL | Status: AC
  Filled 2017-01-03: qty 1

## 2017-01-03 MED ORDER — GABAPENTIN 300 MG PO CAPS
600.0000 mg | ORAL_CAPSULE | Freq: Every day | ORAL | Status: AC
  Administered 2017-01-03: 600 mg via ORAL
  Filled 2017-01-03: qty 2

## 2017-01-03 MED ORDER — FENTANYL CITRATE (PF) 100 MCG/2ML IJ SOLN
INTRAMUSCULAR | Status: AC
  Filled 2017-01-03: qty 2

## 2017-01-03 MED ORDER — CEFAZOLIN SODIUM-DEXTROSE 2-4 GM/100ML-% IV SOLN
2.0000 g | INTRAVENOUS | Status: AC
  Administered 2017-01-03: 2 g via INTRAVENOUS
  Filled 2017-01-03: qty 100

## 2017-01-03 MED ORDER — BUPROPION HCL ER (XL) 150 MG PO TB24
150.0000 mg | ORAL_TABLET | Freq: Every day | ORAL | Status: DC
  Administered 2017-01-04: 150 mg via ORAL
  Filled 2017-01-03: qty 1

## 2017-01-03 MED ORDER — PNEUMOCOCCAL VAC POLYVALENT 25 MCG/0.5ML IJ INJ
0.5000 mL | INJECTION | INTRAMUSCULAR | Status: AC
  Administered 2017-01-04: 0.5 mL via INTRAMUSCULAR
  Filled 2017-01-03: qty 0.5

## 2017-01-03 MED ORDER — LIDOCAINE 2% (20 MG/ML) 5 ML SYRINGE
INTRAMUSCULAR | Status: DC | PRN
  Administered 2017-01-03: 60 mg via INTRAVENOUS

## 2017-01-03 MED ORDER — OXYCODONE-ACETAMINOPHEN 5-325 MG PO TABS: 1.0000 | ORAL_TABLET | ORAL | Status: DC | PRN

## 2017-01-03 MED ORDER — DEXAMETHASONE SODIUM PHOSPHATE 10 MG/ML IJ SOLN
INTRAMUSCULAR | Status: AC
  Filled 2017-01-03: qty 1

## 2017-01-03 MED ORDER — ALPRAZOLAM 0.25 MG PO TABS: 0.2500 mg | ORAL_TABLET | Freq: Every day | ORAL | Status: DC | PRN

## 2017-01-03 MED ORDER — HEPARIN SODIUM (PORCINE) 5000 UNIT/ML IJ SOLN
5000.0000 [IU] | INTRAMUSCULAR | Status: AC
  Administered 2017-01-03: 5000 [IU] via SUBCUTANEOUS
  Filled 2017-01-03: qty 1

## 2017-01-03 MED ORDER — PROMETHAZINE HCL 25 MG/ML IJ SOLN: 6.2500 mg | INTRAMUSCULAR | Status: DC | PRN

## 2017-01-03 MED ORDER — ROCURONIUM BROMIDE 50 MG/5ML IV SOSY
PREFILLED_SYRINGE | INTRAVENOUS | Status: AC
  Filled 2017-01-03: qty 5

## 2017-01-03 MED ORDER — PROPOFOL 10 MG/ML IV BOLUS
INTRAVENOUS | Status: AC
  Filled 2017-01-03: qty 20

## 2017-01-03 MED ORDER — MEPERIDINE HCL 50 MG/ML IJ SOLN: 6.2500 mg | INTRAMUSCULAR | Status: DC | PRN

## 2017-01-03 MED ORDER — SENNOSIDES-DOCUSATE SODIUM 8.6-50 MG PO TABS
2.0000 | ORAL_TABLET | Freq: Every day | ORAL | Status: DC
  Administered 2017-01-03: 2 via ORAL
  Filled 2017-01-03: qty 2

## 2017-01-03 MED ORDER — ONDANSETRON HCL 4 MG PO TABS: 4.0000 mg | ORAL_TABLET | Freq: Four times a day (QID) | ORAL | Status: DC | PRN

## 2017-01-03 MED ORDER — LACTATED RINGERS IV SOLN
INTRAVENOUS | Status: DC
  Administered 2017-01-03: 09:00:00 via INTRAVENOUS

## 2017-01-03 MED ORDER — FENTANYL CITRATE (PF) 250 MCG/5ML IJ SOLN
INTRAMUSCULAR | Status: AC
  Filled 2017-01-03: qty 5

## 2017-01-03 MED ORDER — INSULIN ASPART 100 UNIT/ML ~~LOC~~ SOLN
0.0000 [IU] | Freq: Three times a day (TID) | SUBCUTANEOUS | Status: DC
  Administered 2017-01-03: 5 [IU] via SUBCUTANEOUS
  Administered 2017-01-04: 3 [IU] via SUBCUTANEOUS

## 2017-01-03 MED ORDER — SUGAMMADEX SODIUM 200 MG/2ML IV SOLN
INTRAVENOUS | Status: DC | PRN
  Administered 2017-01-03: 200 mg via INTRAVENOUS

## 2017-01-03 MED ORDER — ONDANSETRON HCL 4 MG/2ML IJ SOLN: 4.0000 mg | Freq: Four times a day (QID) | INTRAMUSCULAR | Status: DC | PRN

## 2017-01-03 MED ORDER — FENTANYL CITRATE (PF) 250 MCG/5ML IJ SOLN
INTRAMUSCULAR | Status: DC | PRN
  Administered 2017-01-03 (×3): 50 ug via INTRAVENOUS
  Administered 2017-01-03: 100 ug via INTRAVENOUS

## 2017-01-03 MED ORDER — KETOROLAC TROMETHAMINE 30 MG/ML IJ SOLN
INTRAMUSCULAR | Status: AC
  Filled 2017-01-03: qty 1

## 2017-01-03 MED ORDER — STERILE WATER FOR IRRIGATION IR SOLN
Status: DC | PRN
  Administered 2017-01-03: 1000 mL

## 2017-01-03 MED ORDER — DICLOFENAC SODIUM 50 MG PO TBEC
50.0000 mg | DELAYED_RELEASE_TABLET | Freq: Four times a day (QID) | ORAL | Status: DC
  Administered 2017-01-03 – 2017-01-04 (×3): 50 mg via ORAL
  Filled 2017-01-03 (×4): qty 1

## 2017-01-03 MED ORDER — SCOPOLAMINE 1 MG/3DAYS TD PT72
1.0000 | MEDICATED_PATCH | TRANSDERMAL | Status: DC
  Administered 2017-01-03: 1 via TRANSDERMAL

## 2017-01-03 MED ORDER — ALBUTEROL SULFATE (2.5 MG/3ML) 0.083% IN NEBU: 2.5000 mg | INHALATION_SOLUTION | Freq: Four times a day (QID) | RESPIRATORY_TRACT | Status: DC | PRN

## 2017-01-03 MED ORDER — MIDAZOLAM HCL 2 MG/2ML IJ SOLN
INTRAMUSCULAR | Status: DC | PRN
  Administered 2017-01-03: 2 mg via INTRAVENOUS

## 2017-01-03 MED ORDER — ENOXAPARIN SODIUM 40 MG/0.4ML ~~LOC~~ SOLN
40.0000 mg | SUBCUTANEOUS | Status: DC
  Administered 2017-01-04: 40 mg via SUBCUTANEOUS
  Filled 2017-01-03 (×2): qty 0.4

## 2017-01-03 MED ORDER — KETOROLAC TROMETHAMINE 30 MG/ML IJ SOLN
30.0000 mg | Freq: Once | INTRAMUSCULAR | Status: DC | PRN
  Administered 2017-01-03: 30 mg via INTRAVENOUS

## 2017-01-03 MED ORDER — ONDANSETRON HCL 4 MG/2ML IJ SOLN
INTRAMUSCULAR | Status: DC | PRN
  Administered 2017-01-03: 4 mg via INTRAVENOUS

## 2017-01-03 MED ORDER — ALBUTEROL SULFATE HFA 108 (90 BASE) MCG/ACT IN AERS: 1.0000 | INHALATION_SPRAY | Freq: Four times a day (QID) | RESPIRATORY_TRACT | Status: DC | PRN

## 2017-01-03 MED ORDER — SUGAMMADEX SODIUM 200 MG/2ML IV SOLN
INTRAVENOUS | Status: AC
  Filled 2017-01-03: qty 2

## 2017-01-03 MED ORDER — FENTANYL CITRATE (PF) 100 MCG/2ML IJ SOLN
25.0000 ug | INTRAMUSCULAR | Status: DC | PRN
  Administered 2017-01-03 (×2): 25 ug via INTRAVENOUS
  Administered 2017-01-03: 50 ug via INTRAVENOUS
  Administered 2017-01-03 (×2): 25 ug via INTRAVENOUS

## 2017-01-03 MED ORDER — PANTOPRAZOLE SODIUM 40 MG PO TBEC
40.0000 mg | DELAYED_RELEASE_TABLET | Freq: Every day | ORAL | Status: DC
  Administered 2017-01-04: 40 mg via ORAL
  Filled 2017-01-03: qty 1

## 2017-01-03 MED ORDER — HYDROMORPHONE HCL 1 MG/ML IJ SOLN
0.2000 mg | INTRAMUSCULAR | Status: DC | PRN
  Administered 2017-01-03: 0.6 mg via INTRAVENOUS

## 2017-01-03 MED ORDER — ONDANSETRON HCL 4 MG/2ML IJ SOLN
INTRAMUSCULAR | Status: AC
  Filled 2017-01-03: qty 2

## 2017-01-03 MED ORDER — PROPOFOL 10 MG/ML IV BOLUS
INTRAVENOUS | Status: DC | PRN
  Administered 2017-01-03: 150 mg via INTRAVENOUS

## 2017-01-03 SURGICAL SUPPLY — 47 items
BENZOIN TINCTURE PRP APPL 2/3 (GAUZE/BANDAGES/DRESSINGS) ×3 IMPLANT
CLOSURE WOUND 1/2 X4 (GAUZE/BANDAGES/DRESSINGS) ×1
COVER BACK TABLE 60X90IN (DRAPES) IMPLANT
COVER TIP SHEARS 8 DVNC (MISCELLANEOUS) ×1 IMPLANT
COVER TIP SHEARS 8MM DA VINCI (MISCELLANEOUS) ×2
DECANTER SPIKE VIAL GLASS SM (MISCELLANEOUS) IMPLANT
DRAPE ARM DVNC X/XI (DISPOSABLE) ×4 IMPLANT
DRAPE COLUMN DVNC XI (DISPOSABLE) ×1 IMPLANT
DRAPE DA VINCI XI ARM (DISPOSABLE) ×8
DRAPE DA VINCI XI COLUMN (DISPOSABLE) ×2
DRAPE SHEET LG 3/4 BI-LAMINATE (DRAPES) ×3 IMPLANT
DRAPE SURG IRRIG POUCH 19X23 (DRAPES) ×3 IMPLANT
DRSG TEGADERM 2-3/8X2-3/4 SM (GAUZE/BANDAGES/DRESSINGS) ×15 IMPLANT
ELECT REM PT RETURN 15FT ADLT (MISCELLANEOUS) ×3 IMPLANT
GAUZE SPONGE 2X2 8PLY STRL LF (GAUZE/BANDAGES/DRESSINGS) ×1 IMPLANT
GLOVE BIO SURGEON STRL SZ 6.5 (GLOVE) ×10 IMPLANT
GLOVE BIO SURGEONS STRL SZ 6.5 (GLOVE) ×5
GLOVE BIOGEL PI IND STRL 7.0 (GLOVE) ×2 IMPLANT
GLOVE BIOGEL PI INDICATOR 7.0 (GLOVE) ×4
GOWN STRL NON-REIN LRG LVL3 (GOWN DISPOSABLE) ×3 IMPLANT
GOWN STRL REUS W/ TWL XL LVL3 (GOWN DISPOSABLE) ×2 IMPLANT
GOWN STRL REUS W/TWL XL LVL3 (GOWN DISPOSABLE) ×4
HOLDER FOLEY CATH W/STRAP (MISCELLANEOUS) ×3 IMPLANT
IRRIG SUCT STRYKERFLOW 2 WTIP (MISCELLANEOUS) ×3
IRRIGATION SUCT STRKRFLW 2 WTP (MISCELLANEOUS) ×1 IMPLANT
MANIPULATOR UTERINE 4.5 ZUMI (MISCELLANEOUS) ×3 IMPLANT
NDL SAFETY ECLIPSE 18X1.5 (NEEDLE) IMPLANT
NEEDLE HYPO 18GX1.5 SHARP (NEEDLE)
NEEDLE SPNL 18GX3.5 QUINCKE PK (NEEDLE) IMPLANT
PACK ROBOT GYN CUSTOM WL (TRAY / TRAY PROCEDURE) ×3 IMPLANT
PAD POSITIONING PINK XL (MISCELLANEOUS) ×3 IMPLANT
POUCH SPECIMEN RETRIEVAL 10MM (ENDOMECHANICALS) IMPLANT
SEAL CANN UNIV 5-8 DVNC XI (MISCELLANEOUS) ×4 IMPLANT
SEAL XI 5MM-8MM UNIVERSAL (MISCELLANEOUS) ×8
SET TRI-LUMEN FLTR TB AIRSEAL (TUBING) ×3 IMPLANT
SLEEVE SURGEON STRL (DRAPES) ×3 IMPLANT
SOLUTION ELECTROLUBE (MISCELLANEOUS) ×3 IMPLANT
SPONGE GAUZE 2X2 STER 10/PKG (GAUZE/BANDAGES/DRESSINGS) ×2
STRIP CLOSURE SKIN 1/2X4 (GAUZE/BANDAGES/DRESSINGS) ×2 IMPLANT
SUT VIC AB 0 CT1 27 (SUTURE) ×4
SUT VIC AB 0 CT1 27XBRD ANTBC (SUTURE) ×2 IMPLANT
SYRINGE 10CC LL (SYRINGE) IMPLANT
TRAP SPECIMEN MUCOUS 40CC (MISCELLANEOUS) IMPLANT
TRAY FOLEY BAG SILVER LF 14FR (CATHETERS) IMPLANT
TRAY FOLEY W/METER SILVER 16FR (SET/KITS/TRAYS/PACK) ×3 IMPLANT
UNDERPAD 30X30 (UNDERPADS AND DIAPERS) ×3 IMPLANT
WATER STERILE IRR 1000ML POUR (IV SOLUTION) IMPLANT

## 2017-01-03 NOTE — Op Note (Signed)
PATIENT: Sue Dickson DATE OF BIRTH: Jul 22, 1960 ENCOUNTER DATE: 01/03/2017   Preop Diagnosis: Uterine mass  Postoperative Diagnosis: Uterine myoma  Surgery: Total robotic hysterectomy, bilateral salpingectomy  Surgeons:  Zachari Alberta A. Alycia Rossetti, MD; Lahoma Crocker, MD   Anesthesia: General   Estimated blood loss: 50 ml   IVF: 800 ml   Urine output: 798 ml   Complications: None   Pathology: Uterus, cervix, bilateral fallopian tubes  Operative findings: Omentum adhesions to anterior abdominal wall at site of prior abdominal incision. 5 cm uterine mass consistent with a myoma. S/p bilateral tubal interruption. Normal ovaries.  Procedure: The patient was identified in the preoperative holding area. Informed consent was signed on the chart. Patient was seen history was reviewed and exam was performed.   The patient was then taken to the operating room and placed in the supine position with SCD hose on.  Her arms were tucked at her side with appropriate precautions on the gel pad. General anesthesia was then induced without difficulty. She was then placed in the dorsolithotomy position. Shoulder blocks were then placed in the usual fashion with appropriate precautions. A OG-tube was placed to suction. First timeout was performed to confirm the patient, procedure, antibiotic, allergy status, estimated blood loss and OR time. The perineum was then prepped in the usual fashion with Betadine. A 14 French Foley was inserted into the bladder under sterile conditions. A sterile speculum was placed in the vagina. The cervix was without lesions. The cervix was grasped with a single-tooth tenaculum. The dilator without difficulty. A ZUMI with a medium Koe ring was placed without difficulty. The abdomen was then prepped with 1 Chlor prep sponge per protocol.   Patient was then draped after the prep was dried. Second timeout was performed to confirm the above. After again confirming OG tube placement and  it was to suction. A stab-wound was made in left upper quadrant 2 cm below the costal margin on the left in the midclavicular line. A 5 mm operative report was used to assure intra-abdominal placement. The abdomen was insufflated. At this point all points during the procedure the patient's intra-abdominal pressure was not increased over 15 mm of mercury. After insufflation was complete, the patient was placed in deep Trendelenburg position. 20 cm above the pubic symphysis that area was marked the camera port. Bilateral robotic ports were marked 10 cm from the midline incision at approximately 0 angle. A 4th arm was placed in the LLQ. Under direct visualization each of the trochars was placed into the abdomen. The small bowel was folded on its mesentery to allow visualization to the pelvis. The 5 mm LUQ port was then converted to a 10/12 port under direct visualization.  After assuring adequate visualization, the robot was then docked in the usual fashion. Under direct visualization the robotic instruments replaced.   The round ligament on the patient's right side was transected with monopolar cautery. The anterior and posterior leaves of the broad ligament were then taken down in the usual fashion. The ureter was identified on the medial leaf of the broad ligament. A window was made between the IP and the ureter. The fallopian tube was dissected off. The utero-ovarian was coagulated with bipolar cautery and transected in order to leave the ovaries in situ. The posterior leaf of the broad ligament was taken down to the level of the KOH ring. The bladder flap was created using meticulous dissection and pinpoint cautery. The uterine vessels were coagulated with bipolar cautery. The uterine vessels  were then transected and the C loop was created. The same procedure was performed on the patient's left side.   The pneumo-occulder in the vagina was then insufflated. The colpotomy was then created in the usual fashion.  The specimen was then delivered to the vagina and sent for frozen section. Frozen section returned as a smooth muscle tumor with no atypia, favoring a benign process.   The vaginal cuff was closed with a running 0 Vicryl on CT 1 suture. The abdomen and pelvis were copiously irrigated and noted to be hemostatic. The robotic instruments were removed under direct visualization as were the robotic trochars. The pneumoperitoneum was removed. The patient was then taken out of the Trendelenburg position. Using of 0 Vicryl on a UR 6 needle the LUQ port fascia was closed. The subcutaneous tissues of the port in the left upper quadrant was reapproximated. The skin was closed using monocryl. Steri-Strips and benzoin were applied. The pneumo occluded balloon was removed from the vagina. The vagina was swabbed and noted to be hemostatic.   All instrument needle and Ray-Tec counts were correct x2. The patient tolerated the procedure well and was taken to the recovery room in stable condition. This is Nancy Marus dictating an operative note on patient Sue Dickson.

## 2017-01-03 NOTE — Progress Notes (Signed)
Placing pt in Holding Status at this time.

## 2017-01-03 NOTE — Transfer of Care (Signed)
Immediate Anesthesia Transfer of Care Note  Patient: Sue Dickson  Procedure(s) Performed: XI ROBOTIC ASSISTED TOTAL HYSTERECTOMY, BILATERAL SALPINGECTOMY (Bilateral )  Patient Location: PACU  Anesthesia Type:General  Level of Consciousness: awake and patient cooperative  Airway & Oxygen Therapy: Patient Spontanous Breathing and Patient connected to face mask oxygen  Post-op Assessment: Report given to RN and Post -op Vital signs reviewed and stable  Post vital signs: Reviewed and stable  Last Vitals:  Vitals:   01/03/17 0850 01/03/17 0853  BP: (!) 159/94 (!) 155/91  Pulse: (!) 104 92  Resp: 18 18  Temp: 37.3 C 37.3 C  SpO2: 98% 100%    Last Pain:  Vitals:   01/03/17 0853  TempSrc: Oral      Patients Stated Pain Goal: 3 (70/96/43 [id number])  Complications: No apparent anesthesia complications

## 2017-01-03 NOTE — Anesthesia Preprocedure Evaluation (Signed)
Anesthesia Evaluation  Patient identified by MRN, date of birth, ID band Patient awake    Reviewed: Allergy & Precautions, NPO status , Patient's Chart, lab work & pertinent test results  History of Anesthesia Complications Negative for: history of anesthetic complications  Airway Mallampati: II  TM Distance: >3 FB Neck ROM: Full    Dental no notable dental hx. (+) Dental Advisory Given   Pulmonary neg pulmonary ROS, asthma ,    Pulmonary exam normal        Cardiovascular negative cardio ROS Normal cardiovascular exam     Neuro/Psych negative neurological ROS  negative psych ROS   GI/Hepatic Neg liver ROS, GERD  ,  Endo/Other  diabetes  Renal/GU negative Renal ROS     Musculoskeletal negative musculoskeletal ROS (+)   Abdominal   Peds  Hematology negative hematology ROS (+)   Anesthesia Other Findings   Reproductive/Obstetrics negative OB ROS                             Anesthesia Physical  Anesthesia Plan  ASA: II  Anesthesia Plan: General   Post-op Pain Management:    Induction: Intravenous  PONV Risk Score and Plan: 3 and Ondansetron, Dexamethasone and Scopolamine patch - Pre-op  Airway Management Planned: Oral ETT  Additional Equipment:   Intra-op Plan:   Post-operative Plan: Extubation in OR  Informed Consent: I have reviewed the patients History and Physical, chart, labs and discussed the procedure including the risks, benefits and alternatives for the proposed anesthesia with the patient or authorized representative who has indicated his/her understanding and acceptance.   Dental advisory given  Plan Discussed with: CRNA  Anesthesia Plan Comments:         Anesthesia Quick Evaluation

## 2017-01-03 NOTE — Anesthesia Postprocedure Evaluation (Signed)
Anesthesia Post Note  Patient: Sue Dickson  Procedure(s) Performed: XI ROBOTIC ASSISTED TOTAL HYSTERECTOMY, BILATERAL SALPINGECTOMY (Bilateral )     Patient location during evaluation: PACU Anesthesia Type: General Level of consciousness: sedated and patient cooperative Pain management: pain level controlled Vital Signs Assessment: post-procedure vital signs reviewed and stable Respiratory status: spontaneous breathing Cardiovascular status: stable Anesthetic complications: no    Last Vitals:  Vitals:   01/03/17 1330 01/03/17 1345  BP: (!) 161/102 (!) 161/100  Pulse: 83 90  Resp: 12 16  Temp:    SpO2: 96% 96%    Last Pain:  Vitals:   01/03/17 1415  TempSrc:   PainSc: [place]

## 2017-01-03 NOTE — Progress Notes (Signed)
Husband, Mother and Father are at the pt bedside. VS and orders assessed and pt has no s/s of distress. Pt denies additional complaints and will continue to monitor and tx pt according to MD orders.

## 2017-01-03 NOTE — H&P (Signed)
Chief Complaint  Patient presents with  . Uterine mass    Assessment : Postmenopausal bleeding and uterine mass on certain etiology. I believe this most likely represents an intracavitary myoma and does not represent endometrial cancer. I had a lengthy conversation with the patient and her husband. Greater than 40 minutes face to face time was spent with them answered all of her questions. I discussed that we can proceed with interval follow-up and I would recommend an MRI in 3 months. We'll also discussed proceeding with surgery for definitive diagnosis. After our discussion they wish to proceed with surgery as they want to know definitively what this mass is. We will proceed with a robotic hysterectomy bilateral salpingectomy. The mass will be sent for frozen section with the remainder of the uterus. If it is benign she would like to retain her ovaries even though she is having menopausal symptoms. This malignant she understands will proceed with oophorectomy me and we may proceed with additional staging depending on whether this is an epithelial versus a mesenchymal tumor.  She continues to have vasomotor symptoms and is currently on bupropion. She's not sure this is contributing to what she states as tachycardia or her elevated blood pressures. She will discuss this with her primary care physician. She was a little worried that her preoperative glucose a day of surgery was 175. She has had some elevated blood sugars in the past and was told that some point she may need to be on medication for this.  She will follow-up with her primary care physician regarding her medical issues. She would like to move forward with surgery and she is tentatively scheduled for surgery for November 6.  Risks and benefits of surgery including bleeding, infection, injury to surrounding organs was discussed with the patient and her husband. They know to contact me if they have any other questions prior to that  time.  HPI: 56 year old white married female gravida 3 para 2 seen in consultation request of Dr.Ozan regarding management of a newly diagnosed uterine mass. The patient presented with postmenopausal bleeding approximate 14 months after undergoing menopause. An transvaginal ultrasound revealed the uterus to measure 10 x 7 x 6.2 cm. The endometrium was thickened at 4.5 cm with a solid mass noted within the endometrial canal measuring 4.6 x 4.2 x 3.9 cm. The ovaries appeared normal. There is no free fluid. The mass was to the fine as having significant internal blood flow. Endometrial biopsy was performed showing proliferative endometrium but no hyperplasia or malignancy.  Reproductive: Uterus is notable for a 4.8 x 3.4 x 3.7 cm enhancing mass in the uterine fundus (series 11/image 18). Mildly irregular margins. Thinning of the overlying uterine fundus with possible myometrial invasion. Overall, this appearance favors endometrial carcinoma, with endometrial polyp and degenerating intracavitary fibroid considered less likely.  If this reflects carcinoma, this would likely reflect stage IB disease. No involvement of the cervix or adjacent structures.  IMPRESSION: 4.8 cm enhancing mass in the uterine fundus, worrisome for endometrial carcinoma (stage IB) in the setting of postmenopausal bleeding.  Greater than 20 minutes face to face time with the patient and her husband reviewing the MRI results as well as next steps. She is scheduled for a D&C next Wednesday with me. They had a lot of questions about the possibilities and I discussed with them that I cannot offer more information and we'll was provided in the MRI that this could either be an endometrial cancer, a fibroid or polyp. I discussed  with them that we would try to remove his metastasis of the mass as possible. If it is an invasive endometrial cancer would not be able to remove the entire cancer as it does involve 12 the myometrium. The  questions about what next steps would be involved but there was an endometrial cancer and I briefly discussed that it would be a hysterectomy and we did discuss the role of robotic and minimally invasive surgery. They then got into some details are really too premature to discuss such as the role of chemotherapy and radiation. I discussed with them that I be happy to answer this question should the need arise business really focus on getting a diagnosis.  Interval History: ENCOUNTER DATE: 11/15/16 Surgery: D&C, hysteroscopy, biopsy of uterine mass A pedunculated mass appeared to be a fibroid was seen arising from the left lateral fundal portion of the uterus. It was on a wide stock measuring approximately 1 cm. The mass appeared to be approximate 4 cm. The entire endometrium was quite atrophic in appearance. The mass is well-circumscribed.   Diagnosis Endometrium, curettage - FRAGMENTED, INACTIVE ENDOMETRIAL GLANDS, BENIGN MYOMETRIUM AND ABUNDANT BLOOD. - SEE MICROSCOPIC DESCRIPTION. Microscopic Comment There is abundant blood with scattered admixed fragments of inactive endometrial type glands with focal glandular crowding. While hyperplasia cannot be ruled out, these glands do not show significant cytologic atypia or evidence of malignancy. There are also fragments of benign myometrium present.  Not on File      Past Medical History:  Diagnosis Date  . Diabetes mellitus type 2, diet-controlled ([place])    new dx 10-28-2016  . Exercise-induced asthma   . Factor 5 Leiden mutation, heterozygous ([place])   . GERD (gastroesophageal reflux disease)   . Uterine mass          Past Surgical History:  Procedure Laterality Date  . CESAREAN SECTION  1999   and Bilateral Tubal Ligation  . COLONOSCOPY  2016  . DILATION AND CURETTAGE OF UTERUS  1996   w/ suction due missed ab  . DILATION AND CURETTAGE OF UTERUS N/A 11/16/2016   Procedure: DILATATION AND CURETTAGE OF UTERUS UNDER  ULTRASOUND GUIDANCE, HYSTEROSCOPY;  Surgeon: Nancy Marus, MD;  Location: [facility];  Service: Gynecology;  Laterality: N/A;  . WISDOM TOOTH EXTRACTION  age  19          Current Outpatient Prescriptions  Medication Sig Dispense Refill  . ALBUTEROL IN Inhale into the lungs as needed.    . ALPRAZolam (XANAX) 0.25 MG tablet Take 0.25 mg by mouth daily as needed for anxiety.    Marland Kitchen aspirin EC 81 MG tablet Take 81 mg by mouth every evening.     Marland Kitchen buPROPion (WELLBUTRIN XL) 150 MG 24 hr tablet TAKE 1 TABLET BY MOUTH EVERY DAY IN THE MORNING  5  . cetirizine (ZYRTEC) 5 MG tablet Take 5 mg by mouth daily. 1-2 tablets prn.    . esomeprazole (NEXIUM) 20 MG capsule Take 20 mg by mouth every morning.     Marland Kitchen ibuprofen (ADVIL,MOTRIN) 200 MG tablet Take 200 mg by mouth every 6 (six) hours as needed.    . Multiple Vitamins-Minerals (CENTRUM SILVER 50+WOMEN PO) Take 1 tablet by mouth daily.    Marland Kitchen oxyCODONE-acetaminophen (PERCOCET/ROXICET) 5-325 MG tablet Take 1 tablet by mouth every 6 (six) hours as needed for severe pain. 30 tablet 0   No current facility-administered medications for this visit.     Social History  Social History  . Marital status: Married    Spouse name: N/A  . Number of children: N/A  . Years of education: N/A      Occupational History  . Not on file.       Social History Main Topics  . Smoking status: Never Smoker  . Smokeless tobacco: Never Used  . Alcohol use No  . Drug use: No  . Sexual activity: Not on file       Other Topics Concern  . Not on file      Social History Narrative  . No narrative on file         Family History  Problem Relation Age of Onset  . Uterine cancer Maternal Aunt     Vitals: Blood pressure (!) 150/100, pulse (!) 108, temperature 98 F (36.7 C), temperature source Oral, resp. rate 18, weight 188 lb (85.3 kg), SpO2 100 %.  Physical Exam: General : The patient is a healthy  woman in no acute distress.

## 2017-01-03 NOTE — Interval H&P Note (Signed)
History and Physical Interval Note:  01/03/2017 10:03 AM  Sue Dickson  has presented today for surgery, with the diagnosis of uterine mass  The various methods of treatment have been discussed with the patient and family. After consideration of risks, benefits and other options for treatment, the patient has consented to  Procedure(s): XI ROBOTIC ASSISTED TOTAL HYSTERECTOMY, BILATERAL SALPINGECTOMY, POSSIBLE OOPHERECTOMY (Bilateral) as a surgical intervention .  The patient's history has been reviewed, patient examined, no change in status, stable for surgery.  I have reviewed the patient's chart and labs.  Questions were answered to the patient's satisfaction.     Bogart A.

## 2017-01-03 NOTE — Anesthesia Procedure Notes (Signed)
Procedure Name: Intubation Date/Time: 01/03/2017 10:32 AM Performed by: Dione Booze, CRNA Pre-anesthesia Checklist: Suction available, Emergency Drugs available, Patient identified and Patient being monitored Patient Re-evaluated:Patient Re-evaluated prior to induction Oxygen Delivery Method: Circle system utilized Preoxygenation: Pre-oxygenation with 100% oxygen Induction Type: IV induction Ventilation: Mask ventilation without difficulty Laryngoscope Size: Mac and 4 Grade View: Grade I Tube type: Oral Tube size: 7.5 mm Number of attempts: 1 Airway Equipment and Method: Stylet Placement Confirmation: ETT inserted through vocal cords under direct vision,  positive ETCO2 and breath sounds checked- equal and bilateral Secured at: 22 cm Tube secured with: Tape Dental Injury: Teeth and Oropharynx as per pre-operative assessment

## 2017-01-04 ENCOUNTER — Encounter (HOSPITAL_COMMUNITY): Payer: Self-pay | Admitting: Gynecologic Oncology

## 2017-01-04 ENCOUNTER — Other Ambulatory Visit: Payer: Self-pay

## 2017-01-04 DIAGNOSIS — D259 Leiomyoma of uterus, unspecified: Secondary | ICD-10-CM | POA: Diagnosis not present

## 2017-01-04 LAB — CBC
HCT: 37.6 % (ref 36.0–46.0)
HEMOGLOBIN: 12.5 g/dL (ref 12.0–15.0)
MCH: 28.3 pg (ref 26.0–34.0)
MCHC: 33.2 g/dL (ref 30.0–36.0)
MCV: 85.1 fL (ref 78.0–100.0)
Platelets: 249 10*3/uL (ref 150–400)
RBC: 4.42 MIL/uL (ref 3.87–5.11)
RDW: 13.1 % (ref 11.5–15.5)
WBC: 7.7 10*3/uL (ref 4.0–10.5)

## 2017-01-04 LAB — BASIC METABOLIC PANEL
Anion gap: 8 (ref 5–15)
BUN: 11 mg/dL (ref 6–20)
CHLORIDE: 106 mmol/L (ref 101–111)
CO2: 24 mmol/L (ref 22–32)
Calcium: 8.9 mg/dL (ref 8.9–10.3)
Creatinine, Ser: 0.6 mg/dL (ref 0.44–1.00)
GFR calc Af Amer: >60 mL/min (ref >60)
GFR calc non Af Amer: >60 mL/min (ref >60)
GLUCOSE: 212 mg/dL — AB (ref 65–99)
POTASSIUM: 4 mmol/L (ref 3.5–5.1)
SODIUM: 138 mmol/L (ref 135–145)

## 2017-01-04 LAB — GLUCOSE, CAPILLARY
GLUCOSE-CAPILLARY: 202 mg/dL — AB (ref 65–99)
Glucose-Capillary: 164 mg/dL — ABNORMAL HIGH (ref 65–99)

## 2017-01-04 NOTE — Discharge Summary (Signed)
Physician Discharge Summary  Patient ID: Sue Dickson MRN: [id number] DOB/AGE: 07/24/1960 56 y.o.  Admit date: 01/03/2017 Discharge date: 01/04/2017  Admission Diagnoses: Uterine fibroid  Discharge Diagnoses:  Principal Problem:   Uterine fibroid  Discharged Condition:  The patient is in good condition and stable for discharge.   Hospital Course: On 01/03/2017, the patient underwent the following: Procedure(s): XI ROBOTIC ASSISTED TOTAL HYSTERECTOMY, BILATERAL SALPINGECTOMY.   The postoperative course was uneventful.  She was discharged to home on postoperative day 1 tolerating a regular diet, ambulating, voiding, minimal pain.  She is going to follow up with her PCP about her blood sugar levels and is planning on picking up her meter at discharge.  Consults: None  Significant Diagnostic Studies: None  Treatments: surgery: see above  Discharge Exam: Blood pressure 105/74, pulse 69, temperature 97.9 F (36.6 C), temperature source Oral, resp. rate 18, height 5' 2.5" (1.588 m), weight 192 lb (87.1 kg), SpO2 96 %. General appearance: alert, cooperative, appears stated age and no distress Resp: clear to auscultation bilaterally Cardio: regular rate and rhythm, S1, S2 normal, no murmur, click, rub or gallop GI: soft, non-tender; bowel sounds normal; no masses,  no organomegaly Extremities: extremities normal, atraumatic, no cyanosis or edema Incision/Wound: Lap sites to the abdomen with dermabond without erythema or drainage  Disposition: 01-Home or Self Care  Discharge Instructions    Call MD for:  difficulty breathing, headache or visual disturbances   Complete by:  As directed    Call MD for:  extreme fatigue   Complete by:  As directed    Call MD for:  hives   Complete by:  As directed    Call MD for:  persistant dizziness or light-headedness   Complete by:  As directed    Call MD for:  persistant nausea and vomiting   Complete by:  As directed    Call MD for:  redness,  tenderness, or signs of infection (pain, swelling, redness, odor or green/yellow discharge around incision site)   Complete by:  As directed    Call MD for:  severe uncontrolled pain   Complete by:  As directed    Call MD for:  temperature >100.4   Complete by:  As directed    Diet - low sodium heart healthy   Complete by:  As directed    Discharge instructions   Complete by:  As directed    Begin checking your blood sugars and follow up with your primary care provider.   Driving Restrictions   Complete by:  As directed    No driving for 1 week.  Do not take narcotics and drive.   Increase activity slowly   Complete by:  As directed    Lifting restrictions   Complete by:  As directed    No lifting greater than 10 lbs.   Sexual Activity Restrictions   Complete by:  As directed    No sexual activity, nothing in the vagina, for 8 weeks.     Allergies as of 01/04/2017   No Known Allergies     Medication List    TAKE these medications   albuterol 108 (90 Base) MCG/ACT inhaler Commonly known as:  PROVENTIL HFA;VENTOLIN HFA Inhale 1-2 puffs into the lungs every 6 (six) hours as needed for wheezing or shortness of breath.   ALPRAZolam 0.25 MG tablet Commonly known as:  XANAX Take 0.25 mg by mouth daily as needed for anxiety.   aspirin EC 81 MG tablet Take 81  mg by mouth at bedtime.   buPROPion 150 MG 24 hr tablet Commonly known as:  WELLBUTRIN XL TAKE 150 MG BY MOUTH EVERY DAY IN THE MORNING   cetirizine 10 MG tablet Commonly known as:  ZYRTEC Take 10 mg by mouth daily.   esomeprazole 20 MG capsule Commonly known as:  NEXIUM Take 20 mg by mouth every morning.   hydrocortisone 2.5 % rectal cream Commonly known as:  ANUSOL-HC Place 1 application rectally as needed for hemorrhoids or anal itching.   ibuprofen 200 MG tablet Commonly known as:  ADVIL,MOTRIN Take 400 mg by mouth every 6 (six) hours as needed for headache or moderate pain.   oxyCODONE-acetaminophen 5-325  MG tablet Commonly known as:  PERCOCET/ROXICET Take 1 tablet by mouth every 6 (six) hours as needed for severe pain.        Greater than thirty minutes were spend for face to face discharge instructions and discharge orders/summary in EPIC.   Signed: CROSS, MELISSA DEAL 01/04/2017, 9:08 AM

## 2017-01-04 NOTE — Discharge Instructions (Signed)
01/04/2017  Return to work: 4-6 weeks if applicable  Activity: 1. Be up and out of the bed during the day.  Take a nap if needed.  You may walk up steps but be careful and use the hand rail.  Stair climbing will tire you more than you think, you may need to stop part way and rest.   2. No lifting or straining for 6 weeks.  3. No driving for 1 week(s).  Do not drive if you are taking narcotic pain medicine.  4. Shower daily.  Use soap and water on your incision and pat dry; don't rub.  No tub baths until cleared by your surgeon.   5. No sexual activity and nothing in the vagina for 8 weeks.  6. You may experience a small amount of clear drainage from your incisions, which is normal.  If the drainage persists or increases, please call the office.  7. You may experience vaginal spotting after surgery or around the 6-8 week mark from surgery when the stitches at the top of the vagina begin to dissolve.  The spotting is normal but if you experience heavy bleeding, call our office.  8. Take Tylenol or ibuprofen first for pain and only use Percocet for severe pain not relieved by the Tylenol or Ibuprofen.  Monitor your Tylenol intake to a max of 4,000 mg a day since Percocet has Tylenol in it as well.  You can take 600 mg of ibuprofen every six hours as needed for pain.  Diet: 1. Low sodium Heart Healthy Diet is recommended.  2. It is safe to use a laxative, such as Miralax or Colace, if you have difficulty moving your bowels. You can take Sennakot at bedtime every evening to keep bowel movements regular and to prevent constipation.    Wound Care: 1. Keep clean and dry.  Shower daily.  Reasons to call the Doctor:  Fever - Oral temperature greater than 100.4 degrees Fahrenheit  Foul-smelling vaginal discharge  Difficulty urinating  Nausea and vomiting  Increased pain at the site of the incision that is unrelieved with pain medicine.  Difficulty breathing with or without chest  pain  New calf pain especially if only on one side  Sudden, continuing increased vaginal bleeding with or without clots.   Contacts: For questions or concerns you should contact:  Dr. Nancy Marus at [place], [place] at [phone]  After Hours: call [phone] and have the GYN Oncologist paged/contacted   Blood Glucose Monitoring, Adult Monitoring your blood sugar (glucose) helps you manage your diabetes. It also helps you and your health care provider determine how well your diabetes management plan is working. Blood glucose monitoring involves checking your blood glucose as often as directed, and keeping a record (log) of your results over time. Why should I monitor my blood glucose? Checking your blood glucose regularly can:  Help you understand how food, exercise, illnesses, and medicines affect your blood glucose.  Let you know what your blood glucose is at any time. You can quickly tell if you are having low blood glucose (hypoglycemia) or high blood glucose (hyperglycemia).  Help you and your health care provider adjust your medicines as needed.  When should I check my blood glucose? Follow instructions from your health care provider about how often to check your blood glucose. This may depend on:  The type of diabetes you have.  How well-controlled your diabetes is.  Medicines you are taking.  If you have type  1 diabetes:  Check your blood glucose at least 2 times a day.  Also check your blood glucose: ? Before every insulin injection. ? Before and after exercise. ? Between meals. ? 2 hours after a meal. ? Occasionally between 2:00 a.m. and 3:00 a.m., as directed. ? Before potentially dangerous tasks, like driving or using heavy machinery. ? At bedtime.  You may need to check your blood glucose more often, up to 6-10 times a day: ? If you use an insulin pump. ? If you need multiple daily injections (MDI). ? If your diabetes is not  well-controlled. ? If you are ill. ? If you have a history of severe hypoglycemia. ? If you have a history of not knowing when your blood glucose is getting low (hypoglycemia unawareness). If you have type 2 diabetes:  If you take insulin or other diabetes medicines, check your blood glucose at least 2 times a day.  If you are on intensive insulin therapy, check your blood glucose at least 4 times a day. Occasionally, you may also need to check between 2:00 a.m. and 3:00 a.m., as directed.  Also check your blood glucose: ? Before and after exercise. ? Before potentially dangerous tasks, like driving or using heavy machinery.  You may need to check your blood glucose more often if: ? Your medicine is being adjusted. ? Your diabetes is not well-controlled. ? You are ill. What is a blood glucose log?  A blood glucose log is a record of your blood glucose readings. It helps you and your health care provider: ? Look for patterns in your blood glucose over time. ? Adjust your diabetes management plan as needed.  Every time you check your blood glucose, write down your result and notes about things that may be affecting your blood glucose, such as your diet and exercise for the day.  Most glucose meters store a record of glucose readings in the meter. Some meters allow you to download your records to a computer. How do I check my blood glucose? Follow these steps to get accurate readings of your blood glucose: Supplies needed   Blood glucose meter.  Test strips for your meter. Each meter has its own strips. You must use the strips that come with your meter.  A needle to prick your finger (lancet). Do not use lancets more than once.  A device that holds the lancet (lancing device).  A journal or log book to write down your results. Procedure  Wash your hands with soap and water.  Prick the side of your finger (not the tip) with the lancet. Use a different finger each  time.  Gently rub the finger until a small drop of blood appears.  Follow instructions that come with your meter for inserting the test strip, applying blood to the strip, and using your blood glucose meter.  Write down your result and any notes. Alternative testing sites  Some meters allow you to use areas of your body other than your finger (alternative sites) to test your blood.  If you think you may have hypoglycemia, or if you have hypoglycemia unawareness, do not use alternative sites. Use your finger instead.  Alternative sites may not be as accurate as the fingers, because blood flow is slower in these areas. This means that the result you get may be delayed, and it may be different from the result that you would get from your finger.  The most common alternative sites are: ? Forearm. ? Thigh. ?  Palm of the hand. Additional tips  Always keep your supplies with you.  If you have questions or need help, all blood glucose meters have a 24-hour hotline number that you can call. You may also contact your health care provider.  After you use a few boxes of test strips, adjust (calibrate) your blood glucose meter by following instructions that came with your meter. This information is not intended to replace advice given to you by your health care provider. Make sure you discuss any questions you have with your health care provider. Document Released: 02/17/2003 Document Revised: 09/04/2015 Document Reviewed: 07/27/2015 Elsevier Interactive Patient Education  2017 [employer].

## 2017-01-04 NOTE — Progress Notes (Signed)
Pt alert, oriented, ambulating, tolerating diet.  D/C instructions given, all questions answered. Pt was d/cd home.

## 2017-01-05 ENCOUNTER — Telehealth: Payer: Self-pay | Admitting: Gynecologic Oncology

## 2017-01-05 NOTE — Telephone Encounter (Signed)
Patient returned call and states she is doing very well.  No concerns voiced.

## 2017-01-05 NOTE — Telephone Encounter (Signed)
Attempted to call patient to check on post-op status.  Left message with final path results and advised her to please call for any questions or concerns.

## 2017-01-24 NOTE — Progress Notes (Signed)
Consult Note: Gyn-Onc   Sue Dickson 56 y.o. female  Chief Complaint  Patient presents with  . Post-operative state    Assessment : Postmenopausal bleeding and uterine mass on certain etiology. We felt that this most likely represents an intracavitary myoma and did not represent endometrial cancer. Fortunately her pathology was unremarkable.  She is doing well and will be relased from our clinic. She knows that I will be happy to see her in the future should the need arise. She will return to work on 02/08/17.  She refrain from intercourse, heavy lifting pressure pulling for an additional 3 weeks.  HPI: 56 year old white married female gravida 3 para 2 seen in consultation request of Dr.Ozan regarding management of a newly diagnosed uterine mass. The patient presented with postmenopausal bleeding approximate 14 months after undergoing menopause. An transvaginal ultrasound revealed the uterus to measure 10 x 7 x 6.2 cm. The endometrium was thickened at 4.5 cm with a solid mass noted within the endometrial canal measuring 4.6 x 4.2 x 3.9 cm. The ovaries appeared normal. There is no free fluid. The mass was to the fine as having significant internal blood flow. Endometrial biopsy was performed showing proliferative endometrium but no hyperplasia or malignancy.  Reproductive: Uterus is notable for a 4.8 x 3.4 x 3.7 cm enhancing mass in the uterine fundus (series 11/image 18). Mildly irregular margins. Thinning of the overlying uterine fundus with possible myometrial invasion. Overall, this appearance favors endometrial carcinoma, with endometrial polyp and degenerating intracavitary fibroid considered less likely.  If this reflects carcinoma, this would likely reflect stage IB disease. No involvement of the cervix or adjacent structures.  IMPRESSION: 4.8 cm enhancing mass in the uterine fundus, worrisome for endometrial carcinoma (stage IB) in the setting of postmenopausal bleeding.  ENCOUNTER  DATE: 11/15/16 Surgery: D&C, hysteroscopy, biopsy of uterine mass A pedunculated mass appeared to be a fibroid was seen arising from the left lateral fundal portion of the uterus. It was on a wide stock measuring approximately 1 cm. The mass appeared to be approximate 4 cm. The entire endometrium was quite atrophic in appearance. The mass is well-circumscribed.   Diagnosis Endometrium, curettage - FRAGMENTED, INACTIVE ENDOMETRIAL GLANDS, BENIGN MYOMETRIUM AND ABUNDANT BLOOD. - SEE MICROSCOPIC DESCRIPTION. Microscopic Comment There is abundant blood with scattered admixed fragments of inactive endometrial type glands with focal glandular crowding. While hyperplasia cannot be ruled out, these glands do not show significant cytologic atypia or evidence of malignancy. There are also fragments of benign myometrium present.  Interval History: ENCOUNTER DATE: 01/03/2017 Surgery: Total robotic hysterectomy, bilateral salpingectomy Pathology: Uterus, cervix, bilateral fallopian tubes Operative findings: Omentum adhesions to anterior abdominal wall at site of prior abdominal incision. 5 cm uterine mass consistent with a myoma. S/p bilateral tubal interruption. Normal ovaries.  Diagnosis Uterus, cervix and bilateral fallopian tubes - UTERUS: -ENDOMETRIUM: INACTIVE ENDOMETRIUM. NO HYPERPLASIA OR MALIGNANCY. -MYOMETRIUM: LEIOMYOMA. NO MALIGNANCY. -SEROSA: UNREMARKABLE. NO MALIGNANCY IDENTIFIED. - CERVIX: BENIGN SQUAMOUS AND ENDOCERVICAL MUCOSA. NO DYSPLASIA OR MALIGNANCY. - BILATERAL FALLOPIAN TUBES: PARATUBAL CYSTS. NO MALIGNANCY.  She comes in today for her post op check. She's overall doing fairly well. She is working on stamina is starting to do some walking. She's walking about 30 minutes a day and gets in about 4000 steps present fatigue after that. Her sugars have been quite high. After Thanksgiving elected 266 but she does admit to eating dessert. Next any went down 120 80 been down to 66. She is  working with her primary care physician  regarding this. She did stop her stool softener now which has a bowel movement each day she does feel little discomfort right before emptying the rectal vault. She's had no bleeding. She is very pleased with the pathology results.  No Known Allergies  Past Medical History:  Diagnosis Date  . Diabetes mellitus type 2, diet-controlled ([place])    new dx 08-12-2016  . Exercise-induced asthma   . Factor 5 Leiden mutation, heterozygous ([facility] [place])   . GERD (gastroesophageal reflux disease)   . Uterine mass     Past Surgical History:  Procedure Laterality Date  . CESAREAN SECTION  1999   and Bilateral Tubal Ligation  . COLONOSCOPY  2016  . DILATION AND CURETTAGE OF UTERUS  1996   w/ suction due missed ab  . DILATION AND CURETTAGE OF UTERUS N/A 11/16/2016   Procedure: DILATATION AND CURETTAGE OF UTERUS UNDER ULTRASOUND GUIDANCE, HYSTEROSCOPY;  Surgeon: Nancy Marus, MD;  Location: [facility];  Service: Gynecology;  Laterality: N/A;  . ROBOTIC ASSISTED TOTAL HYSTERECTOMY Bilateral 01/03/2017   Procedure: XI ROBOTIC ASSISTED TOTAL HYSTERECTOMY, BILATERAL SALPINGECTOMY;  Surgeon: Nancy Marus, MD;  Location: [facility];  Service: Gynecology;  Laterality: Bilateral;  . WISDOM TOOTH EXTRACTION  age  26    Current Outpatient Medications  Medication Sig Dispense Refill  . albuterol (PROVENTIL HFA;VENTOLIN HFA) 108 (90 Base) MCG/ACT inhaler Inhale 1-2 puffs into the lungs every 6 (six) hours as needed for wheezing or shortness of breath.     . ALPRAZolam (XANAX) 0.25 MG tablet Take 0.25 mg by mouth daily as needed for anxiety.    Marland Kitchen aspirin EC 81 MG tablet Take 81 mg by mouth at bedtime.     Marland Kitchen buPROPion (WELLBUTRIN XL) 150 MG 24 hr tablet TAKE 150 MG BY MOUTH EVERY DAY IN THE MORNING  5  . cetirizine (ZYRTEC) 10 MG tablet Take 10 mg by mouth daily.     Marland Kitchen esomeprazole (NEXIUM) 20 MG capsule Take 20 mg by mouth every morning.     . hydrocortisone  (ANUSOL-HC) 2.5 % rectal cream Place 1 application rectally as needed for hemorrhoids or anal itching.    Marland Kitchen ibuprofen (ADVIL,MOTRIN) 200 MG tablet Take 400 mg by mouth every 6 (six) hours as needed for headache or moderate pain.     Marland Kitchen oxyCODONE-acetaminophen (PERCOCET/ROXICET) 5-325 MG tablet Take 1 tablet by mouth every 6 (six) hours as needed for severe pain. 30 tablet 0   No current facility-administered medications for this visit.     Social History   Socioeconomic History  . Marital status: Married    Spouse name: Not on file  . Number of children: Not on file  . Years of education: Not on file  . Highest education level: Not on file  Social Needs  . Financial resource strain: Not on file  . Food insecurity - worry: Not on file  . Food insecurity - inability: Not on file  . Transportation needs - medical: Not on file  . Transportation needs - non-medical: Not on file  Occupational History  . Not on file  Tobacco Use  . Smoking status: Never Smoker  . Smokeless tobacco: Never Used  Substance and Sexual Activity  . Alcohol use: No  . Drug use: No  . Sexual activity: Not on file  Other Topics Concern  . Not on file  Social History Narrative  . Not on file    Family History  Problem Relation Age of Onset  . Uterine cancer  Maternal Aunt     Vitals: Blood pressure (!) 150/100, pulse (!) 108, temperature 98 F (36.7 C), temperature source Oral, resp. rate 18, weight 188 lb (85.3 kg), SpO2 100 %.  Physical Exam: General : The patient is a healthy woman in no acute distress.   Abdomen: Well-healed robotic skin incisions. The left upper quadrant port site had a suture in place that was removed. Abdomen is soft and nontender. There are no incisional hernias.  Extremities: No edema.  Pelvic: External genitalia within normal limits. Vagina is well epithelialized. The vaginal cuff is visualized. The suture line is intact. Bimanual examination reveals an intact suture line.  There is normal postoperative changes. No tenderness, fluctuance, masses.  Sue Dickson A., MD 01/25/2017, 1:03 PM

## 2017-01-25 ENCOUNTER — Ambulatory Visit: Payer: 59 | Attending: Gynecologic Oncology | Admitting: Gynecologic Oncology

## 2017-01-25 ENCOUNTER — Encounter: Payer: Self-pay | Admitting: Gynecologic Oncology

## 2017-01-25 VITALS — BP 140/100 | HR 90 | Temp 98.0°F | Resp 20 | Wt 196.7 lb

## 2017-01-25 DIAGNOSIS — E119 Type 2 diabetes mellitus without complications: Secondary | ICD-10-CM | POA: Insufficient documentation

## 2017-01-25 DIAGNOSIS — Z9889 Other specified postprocedural states: Secondary | ICD-10-CM | POA: Diagnosis not present

## 2017-01-25 DIAGNOSIS — Z79891 Long term (current) use of opiate analgesic: Secondary | ICD-10-CM | POA: Insufficient documentation

## 2017-01-25 DIAGNOSIS — Z7982 Long term (current) use of aspirin: Secondary | ICD-10-CM | POA: Insufficient documentation

## 2017-01-25 DIAGNOSIS — Z90710 Acquired absence of both cervix and uterus: Secondary | ICD-10-CM | POA: Diagnosis not present

## 2017-01-25 DIAGNOSIS — Z79899 Other long term (current) drug therapy: Secondary | ICD-10-CM | POA: Insufficient documentation

## 2017-01-25 DIAGNOSIS — K219 Gastro-esophageal reflux disease without esophagitis: Secondary | ICD-10-CM | POA: Diagnosis not present

## 2017-01-25 DIAGNOSIS — Z4889 Encounter for other specified surgical aftercare: Secondary | ICD-10-CM | POA: Diagnosis not present

## 2017-01-25 NOTE — Patient Instructions (Signed)
Was a pleasure partnering in your healthcare. Please let us know if we can be of any assistance in the future.

## 2017-02-01 ENCOUNTER — Telehealth: Payer: Self-pay

## 2017-02-01 NOTE — Telephone Encounter (Signed)
Sue Dickson states that she is experiencing some browinsh vaginal  drainage in the last couple of days. It has a foul odor but not a fishy smell. No intercourse since surgery 01-03-17. Reviewed with [name], NP. Told Sue Froman that the drainage is from the internal stiches dissolving.  There is an odor with the discharge.  She needs to monitor  The odor.  She is call if the odor becomes fishy smelling as the can indicate a vaginal infection. Pt verbalized understanding.

## 2017-02-08 ENCOUNTER — Ambulatory Visit: Payer: 59 | Admitting: Gynecologic Oncology

## 2017-02-14 DIAGNOSIS — I10 Essential (primary) hypertension: Secondary | ICD-10-CM | POA: Diagnosis not present

## 2017-02-14 DIAGNOSIS — K219 Gastro-esophageal reflux disease without esophagitis: Secondary | ICD-10-CM | POA: Diagnosis not present

## 2017-02-14 DIAGNOSIS — E119 Type 2 diabetes mellitus without complications: Secondary | ICD-10-CM | POA: Diagnosis not present

## 2017-02-14 DIAGNOSIS — E782 Mixed hyperlipidemia: Secondary | ICD-10-CM | POA: Diagnosis not present

## 2017-03-06 ENCOUNTER — Telehealth: Payer: Self-pay | Admitting: *Deleted

## 2017-03-06 NOTE — Telephone Encounter (Signed)
Returned the patient's call, patient called and left message regarding "some pink color when wiping."  Per Melissa APP informed the patient "this can be normal over the eight post op week, if it continues or get worse over the next couple of week to call us back." Patient verbalized understanding and spoke instructions back to me.

## 2017-05-10 DIAGNOSIS — B079 Viral wart, unspecified: Secondary | ICD-10-CM | POA: Diagnosis not present

## 2017-05-10 DIAGNOSIS — D485 Neoplasm of uncertain behavior of skin: Secondary | ICD-10-CM | POA: Diagnosis not present

## 2017-05-10 DIAGNOSIS — L858 Other specified epidermal thickening: Secondary | ICD-10-CM | POA: Diagnosis not present

## 2017-06-21 DIAGNOSIS — D1801 Hemangioma of skin and subcutaneous tissue: Secondary | ICD-10-CM | POA: Diagnosis not present

## 2017-06-21 DIAGNOSIS — D225 Melanocytic nevi of trunk: Secondary | ICD-10-CM | POA: Diagnosis not present

## 2017-06-21 DIAGNOSIS — L814 Other melanin hyperpigmentation: Secondary | ICD-10-CM | POA: Diagnosis not present

## 2017-06-21 DIAGNOSIS — B078 Other viral warts: Secondary | ICD-10-CM | POA: Diagnosis not present

## 2017-10-27 DIAGNOSIS — Z0000 Encounter for general adult medical examination without abnormal findings: Secondary | ICD-10-CM | POA: Diagnosis not present

## 2017-10-27 DIAGNOSIS — E782 Mixed hyperlipidemia: Secondary | ICD-10-CM | POA: Diagnosis not present

## 2017-10-27 DIAGNOSIS — Z23 Encounter for immunization: Secondary | ICD-10-CM | POA: Diagnosis not present

## 2018-01-16 IMAGING — US US PELVIS COMPLETE
1 series · 13 of 25 positions shown · non-contrast
Comparison: None

CLINICAL DATA: Postmenopausal bleeding.



[Series 1: us pelvis complete · 0.28mm/px · 13 of 49 slices shown]
[im 1/49]
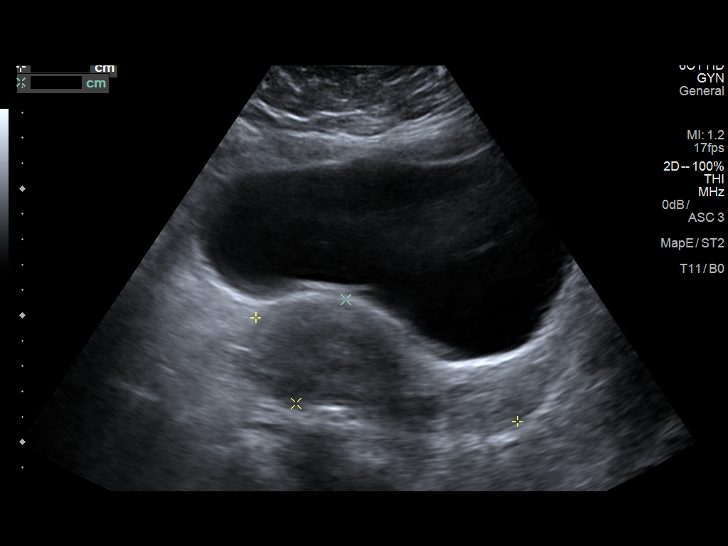
[im 5/49]
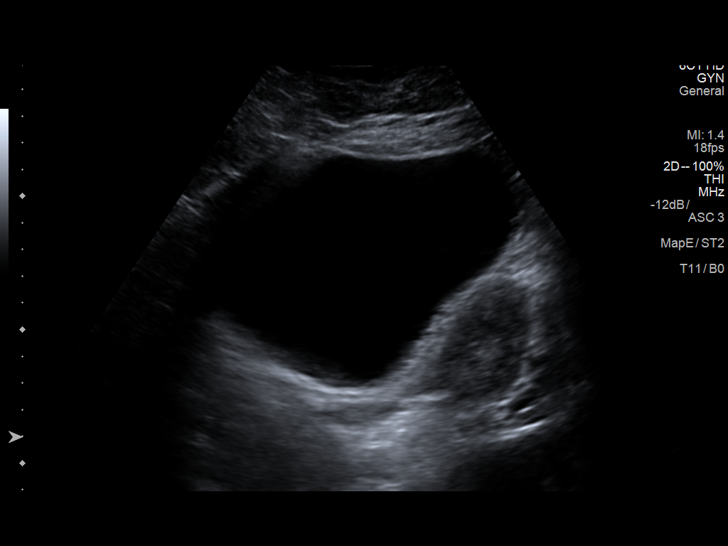
[im 9/49]
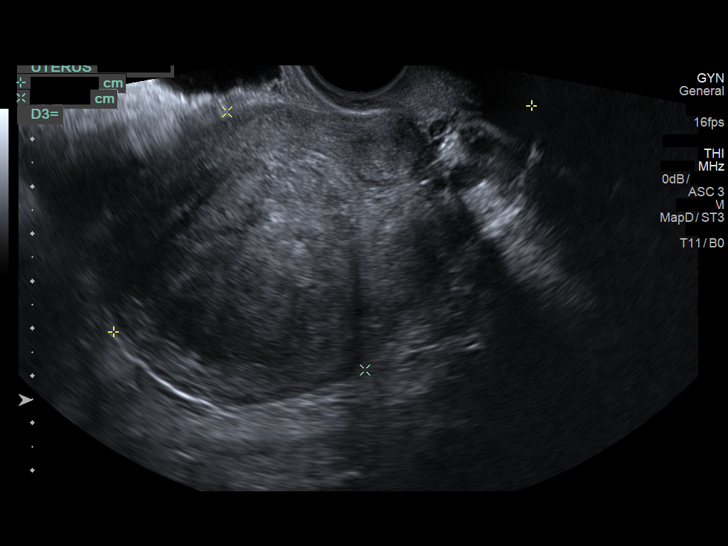
[im 13/49]
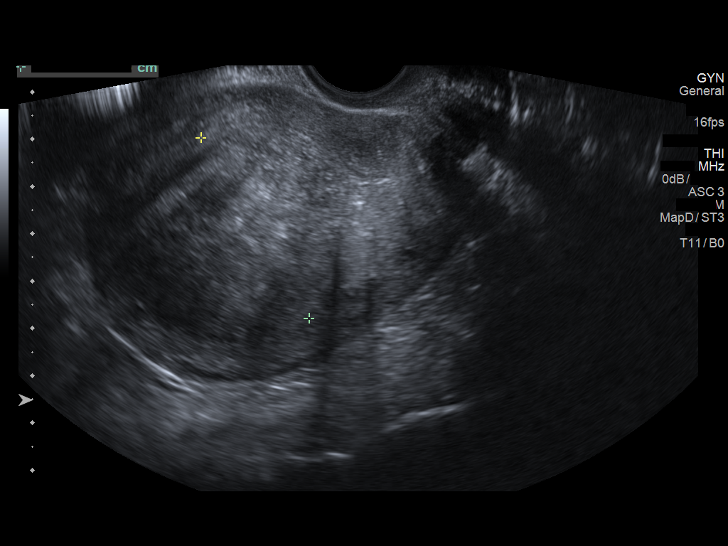
[im 17/49]
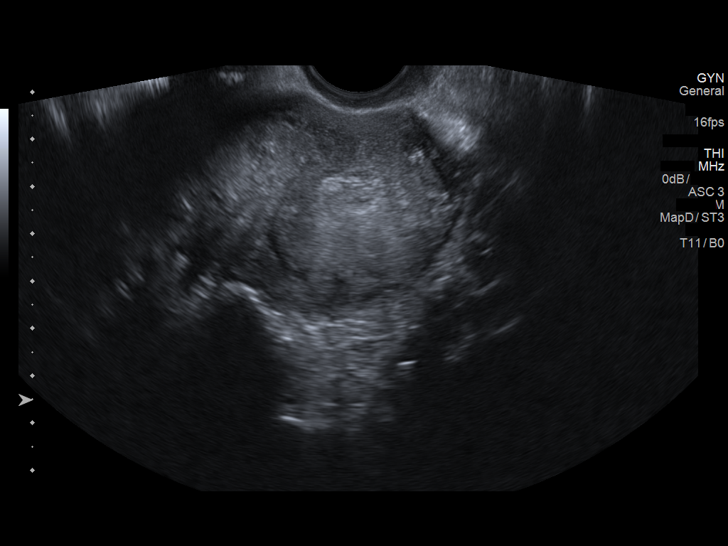
[im 21/49]
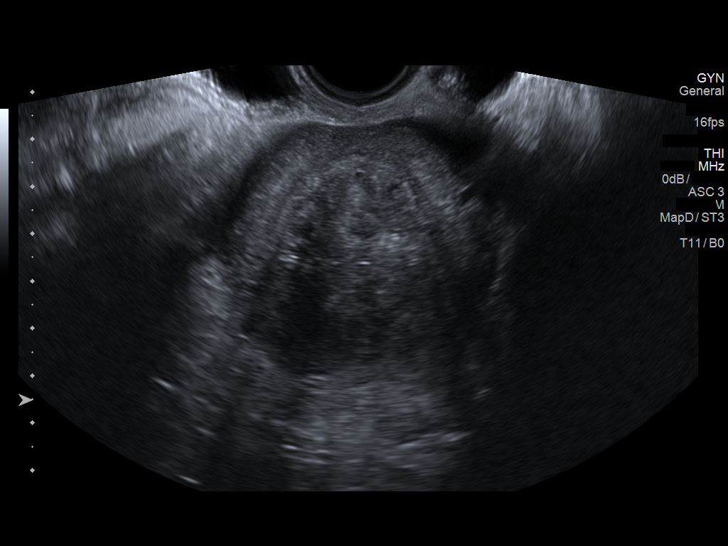
[im 25/49]
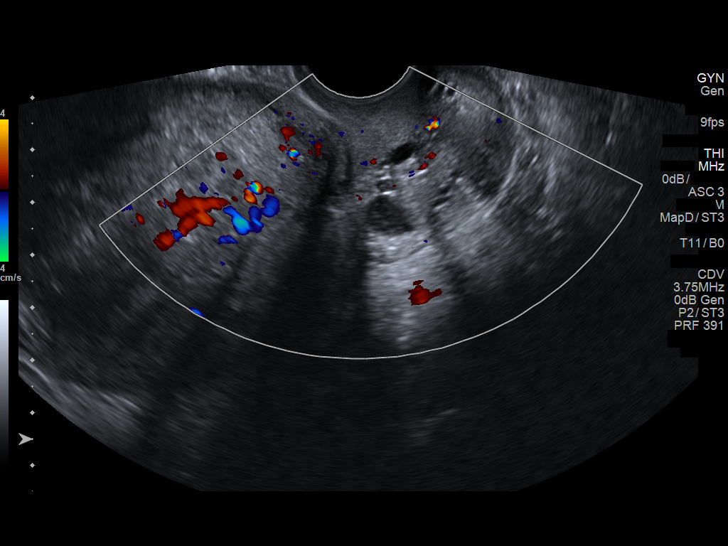
[im 29/49]
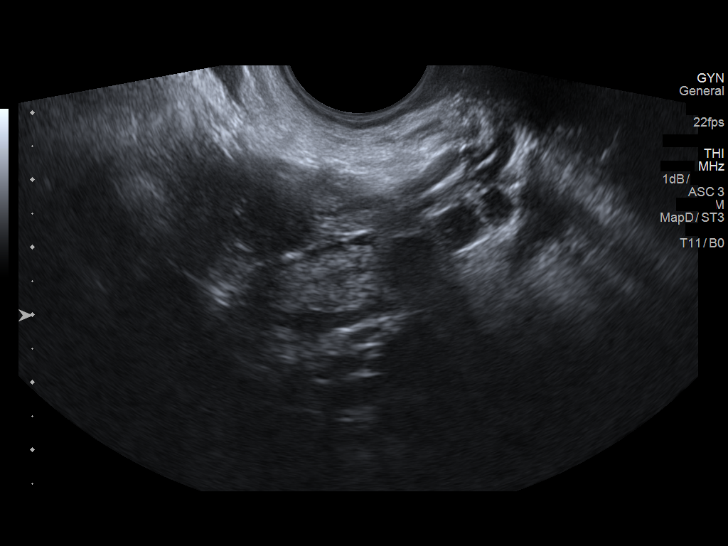
[im 33/49]
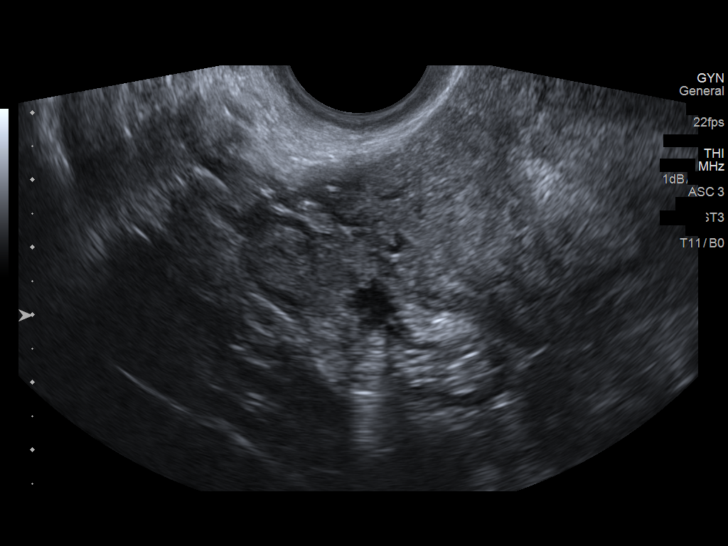
[im 37/49]
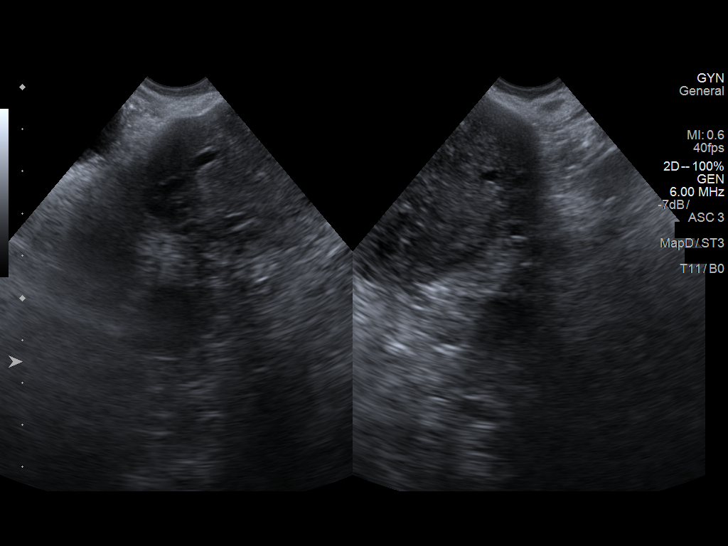
[im 41/49]
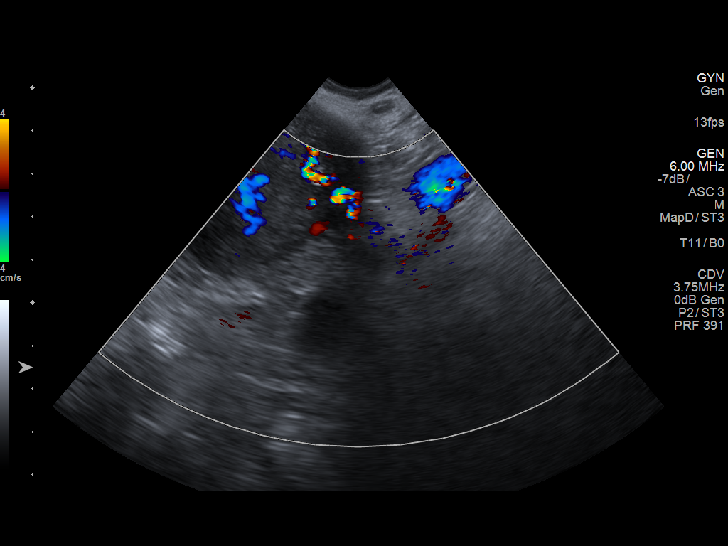
[im 45/49]
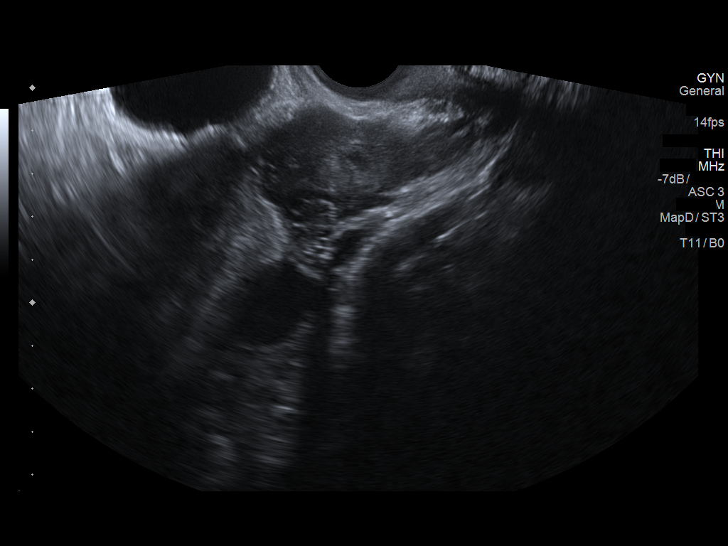
[im 49/49]
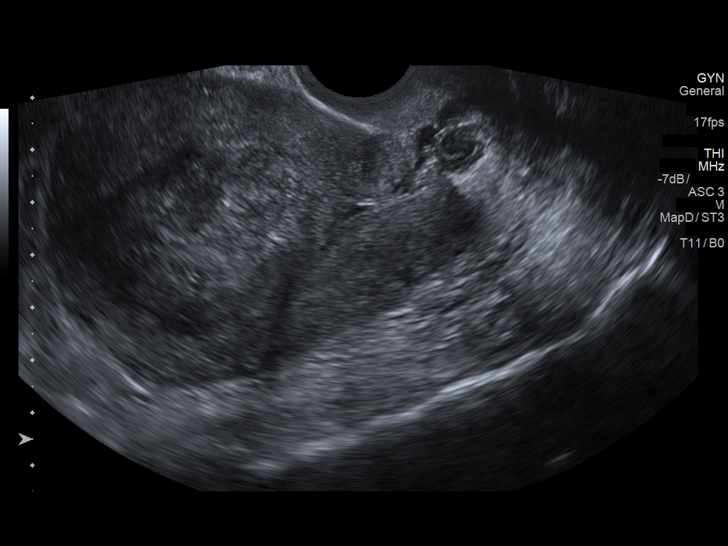

[13 of 25 positions shown; findings below may reference images not displayed]

FINDINGS: Uterus

Measurements: 10.0 x 7.1 x 6.2 cm. No fibroids or other mass
visualized.

Endometrium

Thickness: 4.5 cm. Solid mass is noted within the endometrial canal
measuring 4.6 x 4.2 x 3.9 cm which demonstrates significant internal
blood flow on Doppler, and is highly concerning for neoplasm.

Right ovary

Measurements: 2.8 x 2.5 x 1.2 cm. Normal appearance/no adnexal mass.

Left ovary

Measurements: 3.0 x 2.4 x 1.5 cm. 2.2 cm follicular cyst is noted.

Other findings

No abnormal free fluid.
IMPRESSION: 4.6 x 4.2 x 3.9 cm solid mass is noted in the endometrial canal
concerning for endometrial malignancy. In the setting of
post-menopausal bleeding, endometrial sampling is indicated to
evaluate for carcinoma.(Ref: Radiological Reasoning: Algorithmic
Workup of Abnormal Vaginal Bleeding with Endovaginal Sonography and
Sonohysterography. AJR [id number]; [id number]). These results will be
called to the ordering clinician or representative by the
Radiologist Assistant, and communication documented in the PACS or
zVision Dashboard.

## 2018-04-24 DIAGNOSIS — Z1231 Encounter for screening mammogram for malignant neoplasm of breast: Secondary | ICD-10-CM | POA: Diagnosis not present

## 2018-04-27 DIAGNOSIS — E119 Type 2 diabetes mellitus without complications: Secondary | ICD-10-CM | POA: Diagnosis not present

## 2018-04-27 DIAGNOSIS — I10 Essential (primary) hypertension: Secondary | ICD-10-CM | POA: Diagnosis not present

## 2018-04-27 DIAGNOSIS — E782 Mixed hyperlipidemia: Secondary | ICD-10-CM | POA: Diagnosis not present

## 2018-05-16 DIAGNOSIS — M722 Plantar fascial fibromatosis: Secondary | ICD-10-CM | POA: Diagnosis not present

## 2020-02-16 ENCOUNTER — Telehealth (HOSPITAL_COMMUNITY): Payer: Self-pay

## 2020-02-16 ENCOUNTER — Telehealth: Payer: Self-pay | Admitting: Unknown Physician Specialty

## 2020-02-16 ENCOUNTER — Other Ambulatory Visit: Payer: Self-pay | Admitting: Unknown Physician Specialty

## 2020-02-16 DIAGNOSIS — U071 COVID-19: Secondary | ICD-10-CM

## 2020-02-16 DIAGNOSIS — E118 Type 2 diabetes mellitus with unspecified complications: Secondary | ICD-10-CM

## 2020-02-16 NOTE — Telephone Encounter (Signed)
Called to Discuss with patient about Covid symptoms and the use of the monoclonal antibody infusion for those with mild to moderate Covid symptoms and at a high risk of hospitalization.     Pt appears to qualify for this infusion due to co-morbid conditions and/or a member of an at-risk group in accordance with the FDA Emergency Use Authorization.    Pt stated that her symptoms started on 12/16, she tested positive at [facility] on [address] on 12/17, and c/o fever, back ache, tiredness, slight cough, headache and congestion. Pt states she is unvaccinated, has a history of DM and blood clotting disorder. RN informed pt that an APP will call and verify information and if she qualifies will set up an appointment.   Pt also asking about her daughter who is COVID +, history of blood clotting disorder. RN unsure if she qualifies, informed to ask APP when the call is returned.

## 2020-02-16 NOTE — Telephone Encounter (Signed)
I connected by phone with Sue Dickson on 02/16/2020 at 12:46 PM to discuss the potential use of a new treatment for mild to moderate COVID-19 viral infection in non-hospitalized patients.  This patient is a 59 y.o. female that meets the FDA criteria for Emergency Use Authorization of COVID monoclonal antibody casirivimab/imdevimab, bamlanivimab/etesevimab, or sotrovimab.  Has a (+) direct SARS-CoV-2 viral test result  Has mild or moderate COVID-19   Is NOT hospitalized due to COVID-19  Is within 10 days of symptom onset  Has at least one of the high risk factor(s) for progression to severe COVID-19 and/or hospitalization as defined in EUA.  Specific high risk criteria : BMI > 25 and Diabetes   I have spoken and communicated the following to the patient or parent/caregiver regarding COVID monoclonal antibody treatment:  1. FDA has authorized the emergency use for the treatment of mild to moderate COVID-19 in adults and pediatric patients with positive results of direct SARS-CoV-2 viral testing who are 72 years of age and older weighing at least 40 kg, and who are at high risk for progressing to severe COVID-19 and/or hospitalization.  2. The significant known and potential risks and benefits of COVID monoclonal antibody, and the extent to which such potential risks and benefits are unknown.  3. Information on available alternative treatments and the risks and benefits of those alternatives, including clinical trials.  4. Patients treated with COVID monoclonal antibody should continue to self-isolate and use infection control measures (e.g., wear mask, isolate, social distance, avoid sharing personal items, clean and disinfect "high touch" surfaces, and frequent handwashing) according to CDC guidelines.   5. The patient or parent/caregiver has the option to accept or refuse COVID monoclonal antibody treatment.  After reviewing this information with the patient, the patient has agreed to  receive one of the available covid 19 monoclonal antibodies and will be provided an appropriate fact sheet prior to infusion. Kathrine Haddock, NP 02/16/2020 12:46 PM  Sx onset 12/16

## 2020-02-18 ENCOUNTER — Ambulatory Visit (HOSPITAL_COMMUNITY)
Admission: RE | Admit: 2020-02-18 | Discharge: 2020-02-18 | Disposition: A | Discharge status: Home / Self Care | Payer: 59 | Source: Ambulatory Visit | Attending: Pulmonary Disease | Admitting: Pulmonary Disease

## 2020-02-18 DIAGNOSIS — U071 COVID-19: Secondary | ICD-10-CM | POA: Insufficient documentation

## 2020-02-18 DIAGNOSIS — E118 Type 2 diabetes mellitus with unspecified complications: Secondary | ICD-10-CM | POA: Insufficient documentation

## 2020-02-18 MED ORDER — FAMOTIDINE IN NACL 20-0.9 MG/50ML-% IV SOLN: 20.0000 mg | Freq: Once | INTRAVENOUS | Status: DC | PRN

## 2020-02-18 MED ORDER — DIPHENHYDRAMINE HCL 50 MG/ML IJ SOLN: 50.0000 mg | Freq: Once | INTRAMUSCULAR | Status: DC | PRN

## 2020-02-18 MED ORDER — METHYLPREDNISOLONE SODIUM SUCC 125 MG IJ SOLR: 125.0000 mg | Freq: Once | INTRAMUSCULAR | Status: DC | PRN

## 2020-02-18 MED ORDER — EPINEPHRINE 0.3 MG/0.3ML IJ SOAJ: 0.3000 mg | Freq: Once | INTRAMUSCULAR | Status: DC | PRN

## 2020-02-18 MED ORDER — ALBUTEROL SULFATE HFA 108 (90 BASE) MCG/ACT IN AERS: 2.0000 | INHALATION_SPRAY | Freq: Once | RESPIRATORY_TRACT | Status: DC | PRN

## 2020-02-18 MED ORDER — SODIUM CHLORIDE 0.9 % IV SOLN: Freq: Once | INTRAVENOUS | Status: AC

## 2020-02-18 MED ORDER — SODIUM CHLORIDE 0.9 % IV SOLN: INTRAVENOUS | Status: DC | PRN

## 2020-02-18 NOTE — Progress Notes (Signed)
Patient reviewed Fact Sheet for Patients, Parents, and Caregivers for Emergency Use Authorization (EUA) of bamlanivimab and etesevimab for the Treatment of Coronavirus. Patient also reviewed and is agreeable to the estimated cost of treatment. Patient is agreeable to proceed.   

## 2020-02-18 NOTE — Progress Notes (Signed)
  Diagnosis: COVID-19  Physician:Dr. Wright  Procedure: Covid Infusion Clinic Med: bamlanivimab\etesevimab infusion - Provided patient with bamlanimivab\etesevimab fact sheet for patients, parents and caregivers prior to infusion.  Complications: No immediate complications noted.  Discharge: Discharged home   Dwight Adamczak Marie C 02/18/2020  

## 2020-02-18 NOTE — Discharge Instructions (Signed)
10 Things You Can Do to Manage Your COVID-19 Symptoms at Home If you have possible or confirmed COVID-19: 1. Stay home from work and school. And stay away from other public places. If you must go out, avoid using any kind of public transportation, ridesharing, or taxis. 2. Monitor your symptoms carefully. If your symptoms get worse, call your healthcare provider immediately. 3. Get rest and stay hydrated. 4. If you have a medical appointment, call the healthcare provider ahead of time and tell them that you have or may have COVID-19. 5. For medical emergencies, call 911 and notify the dispatch personnel that you have or may have COVID-19. 6. Cover your cough and sneezes with a tissue or use the inside of your elbow. 7. Wash your hands often with soap and water for at least 20 seconds or clean your hands with an alcohol-based hand sanitizer that contains at least 60% alcohol. 8. As much as possible, stay in a specific room and away from other people in your home. Also, you should use a separate bathroom, if available. If you need to be around other people in or outside of the home, wear a mask. 9. Avoid sharing personal items with other people in your household, like dishes, towels, and bedding. 10. Clean all surfaces that are touched often, like counters, tabletops, and doorknobs. Use household cleaning sprays or wipes according to the label instructions. cdc.gov/coronavirus 08/29/2018 This information is not intended to replace advice given to you by your health care provider. Make sure you discuss any questions you have with your health care provider. Document Revised: 01/31/2019 Document Reviewed: 01/31/2019 Elsevier Patient Education  2020 Elsevier Inc. What types of side effects do monoclonal antibody drugs cause?  Common side effects  In general, the more common side effects caused by monoclonal antibody drugs include: . Allergic reactions, such as hives or itching . Flu-like signs and  symptoms, including chills, fatigue, fever, and muscle aches and pains . Nausea, vomiting . Diarrhea . Skin rashes . Low blood pressure   The CDC is recommending patients who receive monoclonal antibody treatments wait at least 90 days before being vaccinated.  Currently, there are no data on the safety and efficacy of mRNA COVID-19 vaccines in persons who received monoclonal antibodies or convalescent plasma as part of COVID-19 treatment. Based on the estimated half-life of such therapies as well as evidence suggesting that reinfection is uncommon in the 90 days after initial infection, vaccination should be deferred for at least 90 days, as a precautionary measure until additional information becomes available, to avoid interference of the antibody treatment with vaccine-induced immune responses. If you have any questions or concerns after the infusion please call the Advanced Practice Provider on call at [phone]. This number is ONLY intended for your use regarding questions or concerns about the infusion post-treatment side-effects.  Please do not provide this number to others for use. For return to work notes please contact your primary care provider.   If someone you know is interested in receiving treatment please have them call the COVID hotline at [phone].   

## 2022-09-10 ENCOUNTER — Other Ambulatory Visit (HOSPITAL_BASED_OUTPATIENT_CLINIC_OR_DEPARTMENT_OTHER): Payer: Self-pay

## 2022-09-10 MED ORDER — MOUNJARO 10 MG/0.5ML ~~LOC~~ SOAJ
10.0000 mg | SUBCUTANEOUS | 1 refills | Status: AC
  Filled 2022-09-10: qty 2, 28d supply, fill #0
  Filled 2022-10-03: qty 2, 28d supply, fill #1
  Filled 2022-10-31: qty 2, 28d supply, fill #2
  Filled 2022-11-28: qty 2, 28d supply, fill #3
  Filled 2022-12-26: qty 2, 28d supply, fill #4
  Filled 2023-01-23 – 2023-02-01 (×3): qty 2, 28d supply, fill #5

## 2022-09-12 ENCOUNTER — Other Ambulatory Visit (HOSPITAL_COMMUNITY): Payer: Self-pay

## 2022-09-12 ENCOUNTER — Other Ambulatory Visit (HOSPITAL_BASED_OUTPATIENT_CLINIC_OR_DEPARTMENT_OTHER): Payer: Self-pay

## 2022-09-12 MED ORDER — MOUNJARO 10 MG/0.5ML ~~LOC~~ SOAJ
10.0000 mg | SUBCUTANEOUS | 1 refills | Status: AC
  Filled 2022-09-12: qty 2, 28d supply, fill #0

## 2022-10-10 ENCOUNTER — Encounter (HOSPITAL_COMMUNITY): Payer: Self-pay | Admitting: Emergency Medicine

## 2022-10-10 ENCOUNTER — Inpatient Hospital Stay (HOSPITAL_COMMUNITY)
Admission: EM | Admit: 2022-10-10 | Discharge: 2022-10-12 | DRG: 637 | Disposition: A | Discharge status: Home / Self Care | Payer: 59 | Attending: Internal Medicine | Admitting: Internal Medicine

## 2022-10-10 ENCOUNTER — Emergency Department (HOSPITAL_COMMUNITY): Payer: 59

## 2022-10-10 ENCOUNTER — Other Ambulatory Visit: Payer: Self-pay

## 2022-10-10 DIAGNOSIS — G9341 Metabolic encephalopathy: Secondary | ICD-10-CM | POA: Diagnosis present

## 2022-10-10 DIAGNOSIS — D6851 Activated protein C resistance: Secondary | ICD-10-CM | POA: Diagnosis present

## 2022-10-10 DIAGNOSIS — E860 Dehydration: Secondary | ICD-10-CM | POA: Diagnosis present

## 2022-10-10 DIAGNOSIS — E876 Hypokalemia: Secondary | ICD-10-CM | POA: Diagnosis present

## 2022-10-10 DIAGNOSIS — Z7985 Long-term (current) use of injectable non-insulin antidiabetic drugs: Secondary | ICD-10-CM | POA: Diagnosis not present

## 2022-10-10 DIAGNOSIS — Z7982 Long term (current) use of aspirin: Secondary | ICD-10-CM | POA: Diagnosis not present

## 2022-10-10 DIAGNOSIS — Z8049 Family history of malignant neoplasm of other genital organs: Secondary | ICD-10-CM

## 2022-10-10 DIAGNOSIS — Z90710 Acquired absence of both cervix and uterus: Secondary | ICD-10-CM | POA: Diagnosis not present

## 2022-10-10 DIAGNOSIS — E1110 Type 2 diabetes mellitus with ketoacidosis without coma: Principal | ICD-10-CM | POA: Diagnosis present

## 2022-10-10 DIAGNOSIS — N179 Acute kidney failure, unspecified: Secondary | ICD-10-CM | POA: Diagnosis present

## 2022-10-10 DIAGNOSIS — R32 Unspecified urinary incontinence: Secondary | ICD-10-CM | POA: Diagnosis present

## 2022-10-10 DIAGNOSIS — E1111 Type 2 diabetes mellitus with ketoacidosis with coma: Secondary | ICD-10-CM | POA: Diagnosis not present

## 2022-10-10 DIAGNOSIS — Z1152 Encounter for screening for COVID-19: Secondary | ICD-10-CM | POA: Diagnosis not present

## 2022-10-10 DIAGNOSIS — Z79899 Other long term (current) drug therapy: Secondary | ICD-10-CM | POA: Diagnosis not present

## 2022-10-10 LAB — COMPREHENSIVE METABOLIC PANEL
ALT: 25 U/L (ref 0–44)
AST: 18 U/L (ref 15–41)
Albumin: 5 g/dL (ref 3.5–5.0)
Alkaline Phosphatase: 121 U/L (ref 38–126)
BUN: 21 mg/dL — ABNORMAL HIGH (ref 6–20)
CO2: <7 mmol/L — ABNORMAL LOW (ref 22–32)
Calcium: 9.9 mg/dL (ref 8.9–10.3)
Chloride: 109 mmol/L (ref 98–111)
Creatinine, Ser: 1.06 mg/dL — ABNORMAL HIGH (ref 0.44–1.00)
GFR, Estimated: >60 mL/min (ref >60)
Glucose, Bld: 228 mg/dL — ABNORMAL HIGH (ref 70–99)
Potassium: 4.7 mmol/L (ref 3.5–5.1)
Sodium: 139 mmol/L (ref 135–145)
Total Bilirubin: 1.3 mg/dL — ABNORMAL HIGH (ref 0.3–1.2)
Total Protein: 8.7 g/dL — ABNORMAL HIGH (ref 6.5–8.1)

## 2022-10-10 LAB — URINALYSIS, W/ REFLEX TO CULTURE (INFECTION SUSPECTED)
Bacteria, UA: NONE SEEN
Bilirubin Urine: NEGATIVE
Glucose, UA: >=500 mg/dL — AB
Ketones, ur: 80 mg/dL — AB
Leukocytes,Ua: NEGATIVE
Nitrite: NEGATIVE
Protein, ur: 100 mg/dL — AB
Specific Gravity, Urine: 1.018 (ref 1.005–1.030)
pH: 5 (ref 5.0–8.0)

## 2022-10-10 LAB — PROTIME-INR
INR: 1.1 (ref 0.8–1.2)
Prothrombin Time: 14.8 seconds (ref 11.4–15.2)

## 2022-10-10 LAB — CBC WITH DIFFERENTIAL/PLATELET
Abs Immature Granulocytes: 0.42 10*3/uL — ABNORMAL HIGH (ref 0.00–0.07)
Basophils Absolute: 0.1 10*3/uL (ref 0.0–0.1)
Basophils Relative: 0 %
Eosinophils Absolute: 0 10*3/uL (ref 0.0–0.5)
Eosinophils Relative: 0 %
HCT: 53.9 % — ABNORMAL HIGH (ref 36.0–46.0)
Hemoglobin: 16.5 g/dL — ABNORMAL HIGH (ref 12.0–15.0)
Immature Granulocytes: 2 %
Lymphocytes Relative: 7 %
Lymphs Abs: 1.5 10*3/uL (ref 0.7–4.0)
MCH: 26.8 pg (ref 26.0–34.0)
MCHC: 30.6 g/dL (ref 30.0–36.0)
MCV: 87.5 fL (ref 80.0–100.0)
Monocytes Absolute: 1.8 10*3/uL — ABNORMAL HIGH (ref 0.1–1.0)
Monocytes Relative: 8 %
Neutro Abs: 19.3 10*3/uL — ABNORMAL HIGH (ref 1.7–7.7)
Neutrophils Relative %: 83 %
Platelets: 523 10*3/uL — ABNORMAL HIGH (ref 150–400)
RBC: 6.16 MIL/uL — ABNORMAL HIGH (ref 3.87–5.11)
RDW: 15.2 % (ref 11.5–15.5)
WBC: 23.1 10*3/uL — ABNORMAL HIGH (ref 4.0–10.5)
nRBC: 0 % (ref 0.0–0.2)

## 2022-10-10 LAB — BLOOD GAS, VENOUS
Acid-base deficit: 25.1 mmol/L — ABNORMAL HIGH (ref 0.0–2.0)
Bicarbonate: 5.5 mmol/L — ABNORMAL LOW (ref 20.0–28.0)
O2 Saturation: 66.1 %
Patient temperature: 37
pCO2, Ven: 24 mmHg — ABNORMAL LOW (ref 44–60)
pH, Ven: 6.97 — CL (ref 7.25–7.43)
pO2, Ven: 40 mmHg (ref 32–45)

## 2022-10-10 LAB — RESP PANEL BY RT-PCR (RSV, FLU A&B, COVID)  RVPGX2
Influenza A by PCR: NEGATIVE
Influenza B by PCR: NEGATIVE
Resp Syncytial Virus by PCR: NEGATIVE
SARS Coronavirus 2 by RT PCR: NEGATIVE

## 2022-10-10 LAB — I-STAT CG4 LACTIC ACID, ED: Lactic Acid, Venous: 3.4 mmol/L (ref 0.5–1.9)

## 2022-10-10 LAB — LIPASE, BLOOD: Lipase: 161 U/L — ABNORMAL HIGH (ref 11–51)

## 2022-10-10 MED ORDER — INSULIN REGULAR(HUMAN) IN NACL 100-0.9 UT/100ML-% IV SOLN
INTRAVENOUS | Status: DC
  Administered 2022-10-11: 6 [IU]/h via INTRAVENOUS
  Filled 2022-10-10: qty 100

## 2022-10-10 MED ORDER — SODIUM CHLORIDE 0.9 % IV SOLN
2.0000 g | Freq: Once | INTRAVENOUS | Status: AC
  Administered 2022-10-11: 2 g via INTRAVENOUS
  Filled 2022-10-10: qty 12.5

## 2022-10-10 MED ORDER — LACTATED RINGERS IV SOLN: INTRAVENOUS | Status: DC

## 2022-10-10 MED ORDER — SODIUM CHLORIDE 0.9 % IV BOLUS
1000.0000 mL | Freq: Once | INTRAVENOUS | Status: AC
  Administered 2022-10-10: 1000 mL via INTRAVENOUS

## 2022-10-10 MED ORDER — STERILE WATER FOR INJECTION IV SOLN
INTRAVENOUS | Status: DC
  Filled 2022-10-10: qty 150

## 2022-10-10 MED ORDER — SODIUM CHLORIDE 0.9 % IV BOLUS
1000.0000 mL | Freq: Once | INTRAVENOUS | Status: AC
  Administered 2022-10-11: 1000 mL via INTRAVENOUS

## 2022-10-10 MED ORDER — ONDANSETRON HCL 4 MG/2ML IJ SOLN
4.0000 mg | Freq: Once | INTRAMUSCULAR | Status: DC
  Filled 2022-10-10: qty 2

## 2022-10-10 MED ORDER — HEPARIN SODIUM (PORCINE) 5000 UNIT/ML IJ SOLN
5000.0000 [IU] | Freq: Three times a day (TID) | INTRAMUSCULAR | Status: DC
  Administered 2022-10-11 (×3): 5000 [IU] via SUBCUTANEOUS
  Filled 2022-10-10 (×4): qty 1

## 2022-10-10 MED ORDER — METRONIDAZOLE 500 MG/100ML IV SOLN
500.0000 mg | Freq: Once | INTRAVENOUS | Status: AC
  Administered 2022-10-11: 500 mg via INTRAVENOUS
  Filled 2022-10-10: qty 100

## 2022-10-10 MED ORDER — GADOBUTROL 1 MMOL/ML IV SOLN
7.0000 mL | Freq: Once | INTRAVENOUS | Status: AC | PRN
  Administered 2022-10-10: 7 mL via INTRAVENOUS

## 2022-10-10 MED ORDER — VANCOMYCIN HCL IN DEXTROSE 1-5 GM/200ML-% IV SOLN
1000.0000 mg | Freq: Once | INTRAVENOUS | Status: AC
  Administered 2022-10-11: 1000 mg via INTRAVENOUS
  Filled 2022-10-10: qty 200

## 2022-10-10 MED ORDER — DEXTROSE 50 % IV SOLN
0.0000 mL | INTRAVENOUS | Status: DC | PRN
  Filled 2022-10-10: qty 50

## 2022-10-10 MED ORDER — DEXTROSE IN LACTATED RINGERS 5 % IV SOLN
INTRAVENOUS | Status: DC
  Filled 2022-10-10: qty 1000

## 2022-10-10 NOTE — H&P (Signed)
NAME:  Sue Dickson, MRN:  [id number], DOB:  10-Feb-1961, LOS: 0 ADMISSION DATE:  10/10/2022, CONSULTATION DATE:  10/10/22 REFERRING MD:  Wilkie Aye CHIEF COMPLAINT:  AMS, weakness   History of Present Illness:  Sue Dickson is a 62 y.o. female who has a PMH of DM 2, Factor V Leiden mutation, GERD, exercise induced asthma. She presented to [facility] ED 8/12 with generalized weakness, N/V, dizziness, confusion for a few days. Had otherwise been in her usual state of health prior.  In ED, labs suggestive of DKA. Given confusion/AMS, CT head was obtained and this demonstrated incidental finding of prominent hydrocele with decompression of the 4th ventricle with concern for normal pressure hydrocephalus vs ventricular obstruction. Neurology was consulted and it was unclear whether this was new or not, or whether it was related to her DKA/acute illness. There was also some concern for possible venous sinus thrombosis. MRI brain was recommended and this demonstrated prominence of the lateral and third ventricles, out of proportion to cortical sulcation.  Given her neuro imaging findings, it was felt she would be best served with transfer to [facility] campus with admission to neuro ICU for ongoing management/workup. She has been started on insulin, fluids, bicarb, empiric abx in the meantime.  For DM on 4 meds-- most recent addition is Mounjaro. Also on metformin, jardiance.  Pertinent  Medical History:  has Uterine fibroid and Acute metabolic encephalopathy on their problem list.  Significant Hospital Events: Including procedures, antibiotic start and stop dates in addition to other pertinent events   8/12 admit.  Interim History / Subjective:  Feeling better this morning than she was overnight when she first arrived.   Objective:  Blood pressure (!) 141/81, pulse (!) 121, temperature 98.9 F (37.2 C), resp. rate (!) 27, SpO2 100%.        Intake/Output Summary (Last 24 hours) at 10/10/2022 2334 Last data  filed at 10/10/2022 2108 Gross per 24 hour  Intake 600 ml  Output --  Net 600 ml   There were no vitals filed for this visit.  Examination: General: middle aged woman sitting up in bed in NAD Neuro: [place]/AT, eyes anicteric HEENT: [place]/At, eyes anicteric Cardiovascular: S1S2, RRR Lungs: breathing comfortably on RA, CTAB Abdomen: soft, NT Musculoskeletal: Normal muscle mass. Some pain over paraspinal muscles, no midline TTP over spinous processes. No edema.  Skin:  warm, dry Neuro: Awake, alert, answering questions appropriately. Moving all extremities. Normal speech.  Labs/imaging personally reviewed:  CT head 8/12 > prominent hydrocele with decompression of the 4th ventricle. Normal pressure hydrocephalus vs ventricular obstruction. Brain MRI 8/12 > no masses, early white matter changes in peri-ventricular area CXR> normal  AM labs: B OH butyrate still >8 Bicabr now ~20 Phos <1 K+ 3.0 BUN 15 Cr 0.58 WBC 13.5 H/H 11.9/37.9   Assessment & Plan:   DKA with profound metabolic acidosis - euglycemic DKA due to jardiance. No obvious sources of infection. Hx DM 2. - Continue fluids and insulin> due to slow resolution, can switch to fixed dose insulin + titrating d10w - Start Bicarb infusion> now stopping as her bicarb has significantly corrected - Follow BMPs q4h today  - DM education needed. Would not resume jardiance.   Possible sepsis - unclear thus far and no hx to support though has WBC 23 (some hemoconcentration). CXR  & UA WNL. - Empiric Vanc/Cefepime for now. Follow cultures.  - Follow blood cultures. - Repeat lactate. - Check PCT, if low consider early d/c abx but follow her  clinical course too.  AKI - presumed 2/2 dehydration/DKA> improving on IVF - Fluids and insulin as above. - Follow BMP. -strict I/O -renally dose meds, avoid nephrotoxic meds  Hypokalemia hypophosphatemia -ok to stop bicarb infusion this morning -aggressive repletion -recheck later  today  Recent N/V - presumed 2/2 DKA. - Supportive care as above. - Repeat Lipase in AM. -zofran  Hydrocele with decompression of 4th ventricle - hydrocephalus vs ventricular obstruction, no obstruction noted on MRI brain, favor NPH. - Neuro aware, appreciate the assistance. - Notify neuro upon arrival to [facility].  - Continue supportive care.  Acute metabolic encephalopathy due to DKA-- improving per husband -ok to give clear liquid diet for now    Best practice (evaluated daily):  Diet/type: clear liquids DVT prophylaxis: prophylactic heparin  GI prophylaxis: N/A Lines: N/A Foley:  N/A Code Status:  full code Last date of multidisciplinary goals of care discussion: None yet.  Labs   CBC: Recent Labs  Lab 10/10/22 2133  WBC 23.1*  NEUTROABS 19.3*  HGB 16.5*  HCT 53.9*  MCV 87.5  PLT 523*    Basic Metabolic Panel: Recent Labs  Lab 10/10/22 2133  NA 139  K 4.7  CL 109  CO2 <7*  GLUCOSE 228*  BUN 21*  CREATININE 1.06*  CALCIUM 9.9   GFR: CrCl cannot be calculated (Unknown ideal weight.). Recent Labs  Lab 10/10/22 2133 10/10/22 2152  WBC 23.1*  --   LATICACIDVEN  --  3.4*    Liver Function Tests: Recent Labs  Lab 10/10/22 2133  AST 18  ALT 25  ALKPHOS 121  BILITOT 1.3*  PROT 8.7*  ALBUMIN 5.0   Recent Labs  Lab 10/10/22 2133  LIPASE 161*   No results for input(s): "AMMONIA" in the last 168 hours.  ABG    Component Value Date/Time   HCO3 5.5 (L) 10/10/2022 2156   ACIDBASEDEF 25.1 (H) 10/10/2022 2156   O2SAT 66.1 10/10/2022 2156     Coagulation Profile: Recent Labs  Lab 10/10/22 2133  INR 1.1    Cardiac Enzymes: No results for input(s): "CKTOTAL", "CKMB", "CKMBINDEX", "TROPONINI" in the last 168 hours.  HbA1C: Hgb A1c MFr Bld  Date/Time Value Ref Range Status  12/28/2016 02:23 PM 7.6 (H) 4.8 - 5.6 % Final    Comment:    (NOTE) Pre diabetes:          5.7%-6.4% Diabetes:              >6.4% Glycemic control for    <7.0% adults with diabetes     CBG: No results for input(s): "GLUCAP" in the last 168 hours.  Review of Systems:   + nausea, vomiting, confusion, facial droop., SOB No cough, sputum production, rashes, wounds, synovitis, myalgias.  Past Medical History:  She,  has a past medical history of Diabetes mellitus type 2, diet-controlled (HCC), Exercise-induced asthma, Factor 5 Leiden mutation, heterozygous (HCC), GERD (gastroesophageal reflux disease), and Uterine mass.   Surgical History:   Past Surgical History:  Procedure Laterality Date   CESAREAN SECTION  1999   and Bilateral Tubal Ligation   COLONOSCOPY  2016   DILATION AND CURETTAGE OF UTERUS  1996   w/ suction due missed ab   DILATION AND CURETTAGE OF UTERUS N/A 11/16/2016   Procedure: DILATATION AND CURETTAGE OF UTERUS UNDER ULTRASOUND GUIDANCE, HYSTEROSCOPY;  Surgeon: Cleda Mccreedy, MD;  Location: [facility];  Service: Gynecology;  Laterality: N/A;   ROBOTIC ASSISTED TOTAL HYSTERECTOMY Bilateral 01/03/2017   Procedure:  XI ROBOTIC ASSISTED TOTAL HYSTERECTOMY, BILATERAL SALPINGECTOMY;  Surgeon: Cleda Mccreedy, MD;  Location: [facility];  Service: Gynecology;  Laterality: Bilateral;   WISDOM TOOTH EXTRACTION  age  64     Social History:   reports that she has never smoked. She has never used smokeless tobacco. She reports that she does not drink alcohol and does not use drugs.   Family History:  Her family history includes Uterine cancer in her maternal aunt.   Allergies No Known Allergies   Home Medications  Prior to Admission medications   Medication Sig Start Date End Date Taking? Authorizing Provider  albuterol (PROVENTIL HFA;VENTOLIN HFA) 108 (90 Base) MCG/ACT inhaler Inhale 1-2 puffs into the lungs every 6 (six) hours as needed for wheezing or shortness of breath.     [provider]  ALPRAZolam Prudy Feeler) 0.25 MG tablet Take 0.25 mg by mouth daily as needed for anxiety.    [provider]   aspirin EC 81 MG tablet Take 81 mg by mouth at bedtime.     [provider]  buPROPion (WELLBUTRIN XL) 150 MG 24 hr tablet TAKE 150 MG BY MOUTH EVERY DAY IN THE MORNING 10/11/16   [provider]  cetirizine (ZYRTEC) 10 MG tablet Take 10 mg by mouth daily.     [provider]  esomeprazole (NEXIUM) 20 MG capsule Take 20 mg by mouth every morning.     [provider]  hydrocortisone (ANUSOL-HC) 2.5 % rectal cream Place 1 application rectally as needed for hemorrhoids or anal itching.    [provider]  ibuprofen (ADVIL,MOTRIN) 200 MG tablet Take 400 mg by mouth every 6 (six) hours as needed for headache or moderate pain.     [provider]  oxyCODONE-acetaminophen (PERCOCET/ROXICET) 5-325 MG tablet Take 1 tablet by mouth every 6 (six) hours as needed for severe pain. 11/16/16   Cleda Mccreedy, MD  tirzepatide [facility]) 10 MG/0.5ML Pen Inject 10 mg into the skin once a week. 08/30/22     tirzepatide (MOUNJARO) 10 MG/0.5ML Pen Inject 10 mg into the skin once a week. 09/12/22        Critical care time: 35 min   Steffanie Dunn, DO 10/11/22 8:33 AM [facility]  For contact information, see Amion. If no response to pager, please call PCCM consult pager. After hours, 7PM- 7AM, please call Elink.

## 2022-10-10 NOTE — ED Provider Notes (Signed)
[place] EMERGENCY DEPARTMENT AT [facility] Provider Note   CSN: [id number] Arrival date & time: 10/10/22  2059     History  Chief Complaint  Patient presents with   Possible Sepsis    Sue Dickson is a 62 y.o. female.  HPI   62 year old diabetic female presents emergency department with concern for vomiting and change in mental status.  Patient states she started having episodes of nonbloody vomiting last night and it persisted through this morning.  She feels significantly dehydrated.  Husband at bedside states that she seems out of it per her baseline.  No documented fever, cough, sudden headache, diarrhea or urinary symptoms.  Patient is a poor historian secondary to acuity.  Home Medications Prior to Admission medications   Medication Sig Start Date End Date Taking? Authorizing Provider  albuterol (PROVENTIL HFA;VENTOLIN HFA) 108 (90 Base) MCG/ACT inhaler Inhale 1-2 puffs into the lungs every 6 (six) hours as needed for wheezing or shortness of breath.     [provider]  ALPRAZolam Prudy Feeler) 0.25 MG tablet Take 0.25 mg by mouth daily as needed for anxiety.    [provider]  aspirin EC 81 MG tablet Take 81 mg by mouth at bedtime.     [provider]  buPROPion (WELLBUTRIN XL) 150 MG 24 hr tablet TAKE 150 MG BY MOUTH EVERY DAY IN THE MORNING 10/11/16   [provider]  cetirizine (ZYRTEC) 10 MG tablet Take 10 mg by mouth daily.     [provider]  esomeprazole (NEXIUM) 20 MG capsule Take 20 mg by mouth every morning.     [provider]  hydrocortisone (ANUSOL-HC) 2.5 % rectal cream Place 1 application rectally as needed for hemorrhoids or anal itching.    [provider]  ibuprofen (ADVIL,MOTRIN) 200 MG tablet Take 400 mg by mouth every 6 (six) hours as needed for headache or moderate pain.     [provider]  oxyCODONE-acetaminophen (PERCOCET/ROXICET) 5-325 MG tablet Take 1 tablet by mouth  every 6 (six) hours as needed for severe pain. 11/16/16   Cleda Mccreedy, MD  tirzepatide [facility]) 10 MG/0.5ML Pen Inject 10 mg into the skin once a week. 08/30/22     tirzepatide (MOUNJARO) 10 MG/0.5ML Pen Inject 10 mg into the skin once a week. 09/12/22         Allergies    Patient has no known allergies.    Review of Systems   Review of Systems  Constitutional:  Negative for fever.  Respiratory:  Negative for cough and shortness of breath.   Cardiovascular:  Negative for chest pain.  Gastrointestinal:  Positive for nausea and vomiting. Negative for diarrhea.  Genitourinary:  Negative for dysuria.  Musculoskeletal:  Negative for back pain.    Physical Exam Updated Vital Signs BP (!) 141/81   Pulse (!) 121   Temp 98.9 F (37.2 C)   Resp (!) 27   SpO2 100%  Physical Exam Vitals and nursing note reviewed.  Constitutional:      General: She is in acute distress.     Appearance: Normal appearance. She is ill-appearing.  HENT:     Head: Normocephalic.     Mouth/Throat:     Mouth: Mucous membranes are dry.  Eyes:     Pupils: Pupils are equal, round, and reactive to light.  Cardiovascular:     Rate and Rhythm: Tachycardia present.  Pulmonary:     Comments: Tachypneic, heavy breathing Abdominal:     Palpations:  Abdomen is soft.     Tenderness: There is no abdominal tenderness.  Musculoskeletal:        General: No swelling.     Cervical back: No rigidity.  Skin:    General: Skin is warm.  Neurological:     Mental Status: She is alert and oriented to person, place, and time.     Comments: At times confused but able to appropriately answer questions     ED Results / Procedures / Treatments   Labs (all labs ordered are listed, but only abnormal results are displayed) Labs Reviewed  COMPREHENSIVE METABOLIC PANEL - Abnormal; Notable for the following components:      Result Value   CO2 <7 (*)    Glucose, Bld 228 (*)    BUN 21 (*)    Creatinine, Ser 1.06 (*)    Total  Protein 8.7 (*)    Total Bilirubin 1.3 (*)    All other components within normal limits  CBC WITH DIFFERENTIAL/PLATELET - Abnormal; Notable for the following components:   WBC 23.1 (*)    RBC 6.16 (*)    Hemoglobin 16.5 (*)    HCT 53.9 (*)    Platelets 523 (*)    Neutro Abs 19.3 (*)    Monocytes Absolute 1.8 (*)    Abs Immature Granulocytes 0.42 (*)    All other components within normal limits  URINALYSIS, W/ REFLEX TO CULTURE (INFECTION SUSPECTED) - Abnormal; Notable for the following components:   Color, Urine STRAW (*)    APPearance HAZY (*)    Glucose, UA >=500 (*)    Hgb urine dipstick MODERATE (*)    Ketones, ur 80 (*)    Protein, ur 100 (*)    All other components within normal limits  BLOOD GAS, VENOUS - Abnormal; Notable for the following components:   pH, Ven 6.97 (*)    pCO2, Ven 24 (*)    Bicarbonate 5.5 (*)    Acid-base deficit 25.1 (*)    All other components within normal limits  LIPASE, BLOOD - Abnormal; Notable for the following components:   Lipase 161 (*)    All other components within normal limits  I-STAT CG4 LACTIC ACID, ED - Abnormal; Notable for the following components:   Lactic Acid, Venous 3.4 (*)    All other components within normal limits  CULTURE, BLOOD (ROUTINE X 2)  CULTURE, BLOOD (ROUTINE X 2)  RESP PANEL BY RT-PCR (RSV, FLU A&B, COVID)  RVPGX2  PROTIME-INR  BETA-HYDROXYBUTYRIC ACID  HIV ANTIBODY (ROUTINE TESTING W REFLEX)  BASIC METABOLIC PANEL  BASIC METABOLIC PANEL  BASIC METABOLIC PANEL  BASIC METABOLIC PANEL  BETA-HYDROXYBUTYRIC ACID  BETA-HYDROXYBUTYRIC ACID  HEMOGLOBIN A1C  LACTIC ACID, PLASMA  LACTIC ACID, PLASMA  CBC  PHOSPHORUS  MAGNESIUM    EKG None  Radiology CT Head Wo Contrast  Result Date: 10/10/2022 CLINICAL DATA:  Mental status change of unknown cause. Increasing weakness over the last couple of days. Nausea, vomiting, and dizziness. EXAM: CT HEAD WITHOUT CONTRAST TECHNIQUE: Contiguous axial images were  obtained from the base of the skull through the vertex without intravenous contrast. RADIATION DOSE REDUCTION: This exam was performed according to the departmental dose-optimization program which includes automated exposure control, adjustment of the mA and/or kV according to patient size and/or use of iterative reconstruction technique. COMPARISON:  None Available. FINDINGS: Brain: Prominent hydrocephalus with diffuse enlargement of the lateral ventricles and third ventricle. Fourth ventricle is decompressed. Changes may represent normal pressure hydrocephalus or ventricular  obstruction. MRI is suggested for further evaluation. Periventricular low-attenuation may represent small vessel ischemia or reabsorption of fluid. Gray-white matter junctions are distinct. No mass-effect or midline shift. No abnormal extra-axial fluid collections. Basal cisterns are not effaced. No acute intracranial hemorrhage. Vascular: No hyperdense vessel or unexpected calcification. Skull: Normal. Negative for fracture or focal lesion. Sinuses/Orbits: No acute finding. Other: None. IMPRESSION: Prominent hydrocele this with decompression of the fourth ventricle. Consider normal pressure hydrocephalus versus ventricular obstruction. MRI is recommended for further evaluation. Electronically Signed   By: Burman Nieves M.D.   On: 10/10/2022 22:28   DG Chest 2 View  Result Date: 10/10/2022 CLINICAL DATA:  Suspected sepsis. Weakness, nausea and vomiting, and dizziness. EXAM: CHEST - 2 VIEW COMPARISON:  None Available. FINDINGS: The heart size and mediastinal contours are within normal limits. No consolidation, effusion, or pneumothorax. No acute osseous abnormality. IMPRESSION: No active cardiopulmonary disease. Electronically Signed   By: Thornell Sartorius M.D.   On: 10/10/2022 21:55    Procedures .Critical Care  Performed by: Rozelle Logan, DO Authorized by: Rozelle Logan, DO   Critical care provider statement:    Critical  care time (minutes):  75   Critical care time was exclusive of:  Separately billable procedures and treating other patients   Critical care was necessary to treat or prevent imminent or life-threatening deterioration of the following conditions:  Sepsis, dehydration, endocrine crisis and CNS failure or compromise   Critical care was time spent personally by me on the following activities:  Development of treatment plan with patient or surrogate, discussions with consultants, evaluation of patient's response to treatment, examination of patient, ordering and review of laboratory studies, ordering and review of radiographic studies, ordering and performing treatments and interventions, pulse oximetry, re-evaluation of patient's condition and review of old charts   I assumed direction of critical care for this patient from another provider in my specialty: no     Care discussed with: admitting provider       Medications Ordered in ED Medications  ondansetron (ZOFRAN) injection 4 mg (0 mg Intravenous Hold 10/10/22 2321)  sodium chloride 0.9 % bolus 1,000 mL (0 mLs Intravenous Hold 10/10/22 2322)  heparin injection 5,000 Units (has no administration in time range)  insulin regular, human (MYXREDLIN) 100 units/ 100 mL infusion (has no administration in time range)  lactated ringers infusion (has no administration in time range)  dextrose 5 % in lactated ringers infusion (has no administration in time range)  dextrose 50 % solution 0-50 mL (has no administration in time range)  ceFEPIme (MAXIPIME) 2 g in sodium chloride 0.9 % 100 mL IVPB (has no administration in time range)  metroNIDAZOLE (FLAGYL) IVPB 500 mg (has no administration in time range)  vancomycin (VANCOCIN) IVPB 1000 mg/200 mL premix (has no administration in time range)  sodium bicarbonate 150 mEq in sterile water 1,150 mL infusion (has no administration in time range)  gadobutrol (GADAVIST) 1 MMOL/ML injection 7 mL (has no administration  in time range)  sodium chloride 0.9 % bolus 1,000 mL (1,000 mLs Intravenous New Bag/Given 10/10/22 2207)    ED Course/ Medical Decision Making/ A&P                                 Medical Decision Making Amount and/or Complexity of Data Reviewed Labs: ordered. Radiology: ordered.  Risk Prescription drug management. Decision regarding hospitalization.   62 year old female presents emergency  department with concern for vomiting and confusion.  She is tachycardic and tachypneic on arrival.  At times confused but able to give history.  Initial concern for sepsis versus DKA.  Husband at bedside also mentions that he has noticed the left side of her upper lip seems to get stuck to her teeth or is drooping.  He has noticed this since 2 PM today.  This was about 8 hours ago, outside of the window even if this was an acute stroke.  But because of this we will evaluate mental status change in this complaint with a head CT.  Blood work shows a leukocytosis of 23.  Blood sugar is 238, bicarb is less than 7.  She is significantly acidotic at 6.97.  Looks like a mixed picture of DKA and possibly sepsis with unknown source at this time.  Chest x-ray shows no pneumonia.  Head CT shows hydrocephalus which is a new finding for the patient.  No previous head imaging to compare to.  Given this concerning finding I consulted on-call neurologist, Dr. Lucina Mellow he recommends proceeding with an MRI of the brain with and without to evaluate fora.  Ventricle obstruction versus venous thrombosis.  This has been ordered.  Consulted ICU.  In regards to her mixed picture we have agreed to go ahead and start treating for DKA and sepsis of unknown source while we are pending MRI of the brain results.  We agree that she would be best suited admitted over at [facility] with the ICU team in regards to the new neurofindings.  This has been explained to the patient and family.  Patient admitted to ICU at [facility].  She is  pending MRI of the brain.  After IV hydration tachycardia slightly improved but not resolved.  Blood pressure remained stable, she continues to be afebrile.        Final Clinical Impression(s) / ED Diagnoses Final diagnoses:  Diabetic ketoacidosis without coma associated with type 2 diabetes mellitus [facility])    Rx / DC Orders ED Discharge Orders     None         Rozelle Logan, DO 10/10/22 2353

## 2022-10-10 NOTE — ED Notes (Signed)
Patient transported to MRI 

## 2022-10-10 NOTE — ED Triage Notes (Signed)
Pt arriving via [facility] from home for increased weakness over the last couple days, N/V, and dizziness when she stands.

## 2022-10-10 NOTE — ED Notes (Signed)
iStat critical value. MD and RN made aware.

## 2022-10-10 NOTE — Progress Notes (Signed)
A consult was received from an ED physician for Vancomycin and Cefepime per pharmacy dosing.  The patient's profile has been reviewed for ht/wt/allergies/indication/available labs.    A one time order has been placed for Vancomycin 1gm IV and Cefepime 2gm IV.    Further antibiotics/pharmacy consults should be ordered by admitting physician if indicated.                       Thank you, Maryellen Pile, PharmD 10/10/2022  11:25 PM

## 2022-10-10 NOTE — Progress Notes (Incomplete)
eLink Physician-Brief Progress Note Patient Name: Chanchal Niehues DOB: 06/04/1960 MRN: [id number]   Date of Service  10/10/2022  HPI/Events of Note  53F who p/w N/V, weakness, dizziness, AMS  Labs: WBC 23, Hg 16.5. BUN/Cr 21/1.06. CBG 238. CO2 <7. VBG pH 6.97/24. CXR neg infiltrate. CT head with hydrocephalus. MRI brain prominence of lateral and third ventricles.   PCCM for admission. Remains in the [facility] ED. Unable to video in.  Repeat labs with worsening K 5.2, CO2 <7, unchanged BUN/Cr 23/1.08  Confirmed with ED RN patient is on D5 LR 125 cc/h with recent CBG 200s. Has not started bicarb gtt  eICU Interventions  -Pending transfer to [facility] -LR bolus 500 cc now for hyperkalemia -Give 2 amps of bicarb. RN contacting pharmacy to verify bicarb gtt -Continue IVF, insulin, serial BMET/BHA per DKA protocol -Broad spectrum abx      Intervention Category Evaluation Type: New Patient Evaluation  Jenice Leiner Mechele Collin 10/10/2022, 11:57 PM

## 2022-10-11 DIAGNOSIS — E876 Hypokalemia: Secondary | ICD-10-CM | POA: Diagnosis not present

## 2022-10-11 DIAGNOSIS — G9341 Metabolic encephalopathy: Secondary | ICD-10-CM

## 2022-10-11 DIAGNOSIS — N179 Acute kidney failure, unspecified: Secondary | ICD-10-CM

## 2022-10-11 DIAGNOSIS — E1111 Type 2 diabetes mellitus with ketoacidosis with coma: Secondary | ICD-10-CM | POA: Diagnosis not present

## 2022-10-11 LAB — BASIC METABOLIC PANEL
Anion gap: 9 (ref 5–15)
Anion gap: 14 (ref 5–15)
BUN: 10 mg/dL (ref 6–20)
BUN: 8 mg/dL (ref 6–20)
CO2: 15 mmol/L — ABNORMAL LOW (ref 22–32)
CO2: 16 mmol/L — ABNORMAL LOW (ref 22–32)
Calcium: 9 mg/dL (ref 8.9–10.3)
Calcium: 9.4 mg/dL (ref 8.9–10.3)
Chloride: 111 mmol/L (ref 98–111)
Chloride: 108 mmol/L (ref 98–111)
Creatinine, Ser: 0.6 mg/dL (ref 0.44–1.00)
Creatinine, Ser: 0.62 mg/dL (ref 0.44–1.00)
GFR, Estimated: >60 mL/min (ref >60)
GFR, Estimated: >60 mL/min (ref >60)
Glucose, Bld: 111 mg/dL — ABNORMAL HIGH (ref 70–99)
Glucose, Bld: 135 mg/dL — ABNORMAL HIGH (ref 70–99)
Potassium: 3 mmol/L — ABNORMAL LOW (ref 3.5–5.1)
Potassium: 3.9 mmol/L (ref 3.5–5.1)
Sodium: 135 mmol/L (ref 135–145)
Sodium: 138 mmol/L (ref 135–145)

## 2022-10-11 LAB — PHOSPHORUS: Phosphorus: <1 mg/dL — CL (ref 2.5–4.6)

## 2022-10-11 LAB — BLOOD GAS, VENOUS
Acid-base deficit: 6.6 mmol/L — ABNORMAL HIGH (ref 0.0–2.0)
Bicarbonate: 17.2 mmol/L — ABNORMAL LOW (ref 20.0–28.0)
O2 Saturation: 72.7 %
Patient temperature: 37
pCO2, Ven: 29 mmHg — ABNORMAL LOW (ref 44–60)
pH, Ven: 7.38 (ref 7.25–7.43)
pO2, Ven: 36 mmHg (ref 32–45)

## 2022-10-11 LAB — GLUCOSE, CAPILLARY
Glucose-Capillary: 177 mg/dL — ABNORMAL HIGH (ref 70–99)
Glucose-Capillary: 166 mg/dL — ABNORMAL HIGH (ref 70–99)
Glucose-Capillary: 157 mg/dL — ABNORMAL HIGH (ref 70–99)
Glucose-Capillary: 144 mg/dL — ABNORMAL HIGH (ref 70–99)
Glucose-Capillary: 298 mg/dL — ABNORMAL HIGH (ref 70–99)
Glucose-Capillary: 199 mg/dL — ABNORMAL HIGH (ref 70–99)
Glucose-Capillary: 163 mg/dL — ABNORMAL HIGH (ref 70–99)
Glucose-Capillary: 166 mg/dL — ABNORMAL HIGH (ref 70–99)
Glucose-Capillary: 121 mg/dL — ABNORMAL HIGH (ref 70–99)
Glucose-Capillary: 129 mg/dL — ABNORMAL HIGH (ref 70–99)
Glucose-Capillary: 187 mg/dL — ABNORMAL HIGH (ref 70–99)
Glucose-Capillary: 121 mg/dL — ABNORMAL HIGH (ref 70–99)
Glucose-Capillary: 110 mg/dL — ABNORMAL HIGH (ref 70–99)

## 2022-10-11 LAB — CBG MONITORING, ED
Glucose-Capillary: 123 mg/dL — ABNORMAL HIGH (ref 70–99)
Glucose-Capillary: 131 mg/dL — ABNORMAL HIGH (ref 70–99)
Glucose-Capillary: 138 mg/dL — ABNORMAL HIGH (ref 70–99)
Glucose-Capillary: 150 mg/dL — ABNORMAL HIGH (ref 70–99)
Glucose-Capillary: 186 mg/dL — ABNORMAL HIGH (ref 70–99)
Glucose-Capillary: 212 mg/dL — ABNORMAL HIGH (ref 70–99)

## 2022-10-11 LAB — BETA-HYDROXYBUTYRIC ACID: Beta-Hydroxybutyric Acid: 0.57 mmol/L — ABNORMAL HIGH (ref 0.05–0.27)

## 2022-10-11 LAB — MRSA NEXT GEN BY PCR, NASAL: MRSA by PCR Next Gen: NOT DETECTED

## 2022-10-11 MED ORDER — ACETAMINOPHEN 325 MG PO TABS
650.0000 mg | ORAL_TABLET | Freq: Four times a day (QID) | ORAL | Status: DC | PRN
  Administered 2022-10-11 – 2022-10-12 (×2): 650 mg via ORAL
  Filled 2022-10-11 (×2): qty 2

## 2022-10-11 MED ORDER — DEXTROSE IN LACTATED RINGERS 5 % IV SOLN: INTRAVENOUS | Status: DC

## 2022-10-11 MED ORDER — SODIUM CHLORIDE 0.9 % IV SOLN
2.0000 g | Freq: Three times a day (TID) | INTRAVENOUS | Status: DC
  Administered 2022-10-11: 2 g via INTRAVENOUS
  Filled 2022-10-11: qty 12.5

## 2022-10-11 MED ORDER — DEXTROSE 50 % IV SOLN: 0.0000 mL | INTRAVENOUS | Status: DC | PRN

## 2022-10-11 MED ORDER — ONDANSETRON HCL 4 MG/2ML IJ SOLN: 4.0000 mg | Freq: Four times a day (QID) | INTRAMUSCULAR | Status: DC | PRN

## 2022-10-11 MED ORDER — INSULIN ASPART 100 UNIT/ML IJ SOLN
1.0000 [IU] | INTRAMUSCULAR | Status: DC
  Administered 2022-10-12 (×2): 1 [IU] via SUBCUTANEOUS

## 2022-10-11 MED ORDER — POTASSIUM & SODIUM PHOSPHATES 280-160-250 MG PO PACK
2.0000 | PACK | Freq: Once | ORAL | Status: AC
  Administered 2022-10-11: 2 via ORAL
  Filled 2022-10-11: qty 2

## 2022-10-11 MED ORDER — OXYCODONE HCL 5 MG PO TABS: 2.5000 mg | ORAL_TABLET | Freq: Four times a day (QID) | ORAL | Status: DC | PRN

## 2022-10-11 MED ORDER — INSULIN (MYXREDLIN) INFUSION FOR HYPERTRIGLYCERIDEMIA
5.0000 [IU]/h | INTRAVENOUS | Status: DC
  Administered 2022-10-11 (×2): 5 [IU]/h via INTRAVENOUS

## 2022-10-11 MED ORDER — LACTATED RINGERS IV BOLUS
500.0000 mL | Freq: Once | INTRAVENOUS | Status: AC
  Administered 2022-10-11: 500 mL via INTRAVENOUS

## 2022-10-11 MED ORDER — VANCOMYCIN HCL 750 MG/150ML IV SOLN
750.0000 mg | Freq: Two times a day (BID) | INTRAVENOUS | Status: DC
  Filled 2022-10-11: qty 150

## 2022-10-11 MED ORDER — LACTATED RINGERS IV SOLN: INTRAVENOUS | Status: DC

## 2022-10-11 MED ORDER — POTASSIUM PHOSPHATES 15 MMOLE/5ML IV SOLN
45.0000 mmol | Freq: Once | INTRAVENOUS | Status: AC
  Administered 2022-10-11: 45 mmol via INTRAVENOUS
  Filled 2022-10-11: qty 15

## 2022-10-11 MED ORDER — POTASSIUM CHLORIDE CRYS ER 20 MEQ PO TBCR
40.0000 meq | EXTENDED_RELEASE_TABLET | Freq: Once | ORAL | Status: AC
  Administered 2022-10-11: 40 meq via ORAL
  Filled 2022-10-11: qty 2

## 2022-10-11 MED ORDER — INSULIN REGULAR(HUMAN) IN NACL 100-0.9 UT/100ML-% IV SOLN
INTRAVENOUS | Status: DC
  Administered 2022-10-11: 2.6 [IU]/h via INTRAVENOUS
  Filled 2022-10-11: qty 100

## 2022-10-11 MED ORDER — POTASSIUM PHOSPHATES 15 MMOLE/5ML IV SOLN
30.0000 mmol | INTRAVENOUS | Status: AC
  Administered 2022-10-11: 30 mmol via INTRAVENOUS
  Filled 2022-10-11: qty 10

## 2022-10-11 MED ORDER — CHLORHEXIDINE GLUCONATE CLOTH 2 % EX PADS
6.0000 | MEDICATED_PAD | Freq: Every day | CUTANEOUS | Status: DC
  Administered 2022-10-11 – 2022-10-12 (×3): 6 via TOPICAL

## 2022-10-11 MED ORDER — DEXTROSE 10 % IV SOLN: INTRAVENOUS | Status: DC

## 2022-10-11 MED ORDER — SODIUM BICARBONATE 8.4 % IV SOLN
100.0000 meq | Freq: Once | INTRAVENOUS | Status: AC
  Administered 2022-10-11: 100 meq via INTRAVENOUS
  Filled 2022-10-11: qty 50

## 2022-10-11 MED ORDER — INSULIN DETEMIR 100 UNIT/ML ~~LOC~~ SOLN
5.0000 [IU] | Freq: Two times a day (BID) | SUBCUTANEOUS | Status: DC
  Administered 2022-10-11: 5 [IU] via SUBCUTANEOUS
  Filled 2022-10-11 (×4): qty 0.05

## 2022-10-11 NOTE — Progress Notes (Addendum)
eLink Physician-Brief Progress Note Patient Name: Ieisha Mallery DOB: November 04, 1960 MRN: [id number]   Date of Service  10/11/2022  HPI/Events of Note  RN asking if the insulin drip should be weaned off. Currently on DKA protocol but had to go up on insulin infusion rate and is just starting the K phos. Anion gap ok on last check but bicarb was 15 and Beta hydroxy was 2.0  eICU Interventions  K phos 45 mmol being started now Check BMP, VBG and beta hydroxy at 9 pm and will then decide      Intervention Category Major Interventions: Hyperglycemia - active titration of insulin therapy  Loras Grieshop G Jakaila Norment 10/11/2022, 7:54 PM  Addendum at 10 pm - BMP, VBG and beta hydroxy levels noted. Bicarb 16 but normal gap at this time. Orders placed to transition off IV insulin. RN will give long acting form and continue iv insulin for 2 hours and then stop IV insulin and D5 at the same time. Low dose SSI ordered as well to begin with. 2 am BMP ordered to make sure anion gap remains closed

## 2022-10-11 NOTE — ED Notes (Signed)
Multiple attempts to obtain pts 5am labs. RN and phlebotomy attempted to get labs.

## 2022-10-11 NOTE — Progress Notes (Signed)
Patient seen and examined at bedside. Full note to follow.   Still in DKA despite BG ~100 this morning. She is less SOB and confused.      Latest Ref Rng & Units 10/11/2022    6:48 AM 10/11/2022    4:16 AM 10/11/2022   12:54 AM  BMP  Glucose 70 - 99 mg/dL 308  657  846   BUN 6 - 20 mg/dL 15  19  23    Creatinine 0.44 - 1.00 mg/dL 9.62  9.52  8.41   Sodium 135 - 145 mmol/L 139  140  138   Potassium 3.5 - 5.1 mmol/L 3.0  3.9  5.2   Chloride 98 - 111 mmol/L 104  119  112   CO2 22 - 32 mmol/L 20  <7  <7   Calcium 8.9 - 10.3 mg/dL 7.3  9.0  9.7      Ok for clear liquid diet Switching to fixed dose insulin with titrating d10w infusion for euglycemic DKA. Suspect jardiance is the cause.  D/w ED RN. She is transfering to [facility] soon.  Steffanie Dunn, DO 10/11/22 8:13 AM [facility]  For contact information, see Amion. If no response to pager, please call PCCM consult pager. After hours, 7PM- 7AM, please call Elink.

## 2022-10-11 NOTE — ED Notes (Signed)
[facility] called. 

## 2022-10-11 NOTE — Inpatient Diabetes Management (Signed)
Inpatient Diabetes Program Recommendations  AACE/ADA: New Consensus Statement on Inpatient Glycemic Control (2015)  Target Ranges:  Prepandial:   less than 140 mg/dL      Peak postprandial:   less than 180 mg/dL (1-2 hours)      Critically ill patients:  140 - 180 mg/dL   Lab Results  Component Value Date   GLUCAP 157 (H) 10/11/2022   HGBA1C 7.6 (H) 12/28/2016    Latest Reference Range & Units 10/11/22 06:48  Beta-Hydroxybutyric Acid 0.05 - 0.27 mmol/L 6.07 (H)  (H): Data is abnormally high  Latest Reference Range & Units Most Recent  Sodium 135 - 145 mmol/L 139 10/11/22 06:48  Potassium 3.5 - 5.1 mmol/L 3.0 (L) 10/11/22 06:48  Chloride 98 - 111 mmol/L 104 10/11/22 06:48  CO2 22 - 32 mmol/L 20 (L) 10/11/22 06:48  Glucose 70 - 99 mg/dL 643 (H) 05/26/49 88:41  Mean Plasma Glucose mg/dL 660.63 01/60/10 93:23  BUN 6 - 20 mg/dL 15 5/57/32 20:25  Creatinine 0.44 - 1.00 mg/dL 4.27 0/62/37 62:83  Calcium 8.9 - 10.3 mg/dL 7.3 (L) 1/51/76 16:07  Anion gap 5 - 15  15 10/11/22 06:48  (L): Data is abnormally low (H): Data is abnormally high  Diabetes history: DM2 Outpatient Diabetes medications: Jardiance 25 mg every day, Metformin 500 mg bid, Mounjaro 10 mg qweek Current orders for Inpatient glycemic control: IV insulin  Inpatient Diabetes Program Recommendations:   Noted Beta-Hydroxybutyric Acid 6.07 and D10 added. Agree with treatment plan and will follow during hospitalization.  Thank you, Billy Fischer. Amazin Pincock, RN, MSN, CDE  Diabetes Coordinator Inpatient Glycemic Control Team Team Pager [phone] (8am-5pm) 10/11/2022 12:18 PM

## 2022-10-11 NOTE — Progress Notes (Addendum)
Spoke with neurology Dr. Selina Cooley about patient's findings of possible NPH on MRI in context of a few days history of AMS as well as history of urinary incontinence on chart review. States she agrees ventricles are enlarged out of proportion to sulci though clinical picture of days-long AMS less likely NPH given this is a chronic condition. Feels AMS most likely 2/2 euglycemic DKA. Recommended following up in clinic unless clinical picture changes.

## 2022-10-11 NOTE — Progress Notes (Signed)
Patient seen, examined at bedside  62 year old female who was transferred from [facility] hospital after she was admitted with a euglycemic DKA, on imaging she was noted to have dilated ventricles out of proportion  She does have some urinary incontinence but denies gait abnormalities or memory issues  Will call neurology      Cheri Fowler, MD [facility] See Amion for pager If no response to pager, please call [phone] until 7pm After 7pm, Please call E-link [phone]

## 2022-10-11 NOTE — Progress Notes (Signed)
Pharmacy Antibiotic Note  Sue Dickson is a 62 y.o. female admitted on 10/10/2022 with possible sepsis.  Pharmacy has been consulted for Vancomycin and Cefepime dosing.  In the ED patient has received Vancomycin 1gm IV and Cefepime 2gm IV x 1 dose each.  Weight = 71.7 kg Height = 62" CrCl ~ 70.2 ml/min  Plan: Cefepime 2gm IV q8h Vancomycin 750 mg IV Q 12 hrs. Goal AUC 400-550.  Expected AUC: 537  SCr used: 0.8 (10/11/22 @ 04:16) Follow renal function F/u culture results and sensitivities    Temp (24hrs), Avg:98.9 F (37.2 C), Min:98.9 F (37.2 C), Max:98.9 F (37.2 C)  Recent Labs  Lab 10/10/22 2133 10/10/22 2152 10/11/22 0054  WBC 23.1*  --   --   CREATININE 1.06*  --  1.08*  LATICACIDVEN  --  3.4*  --     CrCl cannot be calculated (Unknown ideal weight.).    No Known Allergies  Antimicrobials this admission: 8/13 Cefepime >>   8/13 Vancomycin >>    Dose adjustments this admission:    Microbiology results: 8/12 BCx:      Thank you for allowing pharmacy to be a part of this patient's care.  Maryellen Pile, PharmD 10/11/2022 2:17 AM

## 2022-10-12 DIAGNOSIS — G9341 Metabolic encephalopathy: Secondary | ICD-10-CM | POA: Diagnosis not present

## 2022-10-12 LAB — BASIC METABOLIC PANEL
Anion gap: 11 (ref 5–15)
BUN: 5 mg/dL — ABNORMAL LOW (ref 6–20)
CO2: 20 mmol/L — ABNORMAL LOW (ref 22–32)
Calcium: 9.2 mg/dL (ref 8.9–10.3)
Chloride: 110 mmol/L (ref 98–111)
Creatinine, Ser: 0.61 mg/dL (ref 0.44–1.00)
GFR, Estimated: >60 mL/min (ref >60)
Glucose, Bld: 123 mg/dL — ABNORMAL HIGH (ref 70–99)
Potassium: 3.2 mmol/L — ABNORMAL LOW (ref 3.5–5.1)
Sodium: 141 mmol/L (ref 135–145)

## 2022-10-12 LAB — MAGNESIUM: Magnesium: 1.9 mg/dL (ref 1.7–2.4)

## 2022-10-12 LAB — GLUCOSE, CAPILLARY
Glucose-Capillary: 129 mg/dL — ABNORMAL HIGH (ref 70–99)
Glucose-Capillary: 119 mg/dL — ABNORMAL HIGH (ref 70–99)
Glucose-Capillary: 164 mg/dL — ABNORMAL HIGH (ref 70–99)

## 2022-10-12 LAB — PHOSPHORUS: Phosphorus: 2.5 mg/dL (ref 2.5–4.6)

## 2022-10-12 MED ORDER — POTASSIUM CHLORIDE CRYS ER 20 MEQ PO TBCR
40.0000 meq | EXTENDED_RELEASE_TABLET | Freq: Once | ORAL | Status: AC
  Administered 2022-10-12: 40 meq via ORAL
  Filled 2022-10-12: qty 2

## 2022-10-12 MED ORDER — INSULIN ASPART 100 UNIT/ML IJ SOLN: 0.0000 [IU] | Freq: Every day | INTRAMUSCULAR | Status: DC

## 2022-10-12 MED ORDER — INSULIN ASPART 100 UNIT/ML IJ SOLN: 0.0000 [IU] | Freq: Three times a day (TID) | INTRAMUSCULAR | Status: DC

## 2022-10-12 NOTE — TOC Transition Note (Signed)
Transition of Care [facility]) - CM/SW Discharge Note   Patient Details  Name: Valia Thurner MRN: [id number] Date of Birth: 11-07-1960  [facility]) CM/SW Contact:  Tom-Johnson, Hershal Coria, RN Phone Number: 10/12/2022, 11:03 AM   Clinical Narrative:     Patient is scheduled for discharge today.  Readmission Risk Assessment done. Hospital f/u and discharge instructions on AVS. No TOC needs or recommendations noted. Husband, Onalee Hua to transport at discharge.  No further TOC needs noted.      Final next level of care: Home/Self Care Barriers to Discharge: Barriers Resolved   Patient Goals and CMS Choice CMS Medicare.gov Compare Post Acute Care list provided to:: Patient Choice offered to / list presented to : NA  Discharge Placement                  Patient to be transferred to facility by: Husband Name of family member notified: Onalee Hua    Discharge Plan and Services Additional resources added to the After Visit Summary for                  DME Arranged: N/A DME Agency: NA       HH Arranged: NA HH Agency: NA        Social Determinants of Health (SDOH) Interventions SDOH Screenings   Tobacco Use: Low Risk  (10/10/2022)     Readmission Risk Interventions    10/12/2022   11:02 AM  Readmission Risk Prevention Plan  Post Dischage Appt Complete  Medication Screening Complete  Transportation Screening Complete

## 2022-10-12 NOTE — Evaluation (Signed)
Physical Therapy Evaluation Patient Details Name: Sue Dickson MRN: [id number] DOB: [birth year] Today's Date: 10/12/2022  History of Present Illness  62 y.o. female presents to [facility] hospital on 10/11/2022 as a transfer from [facility] with weakness, nasuea/vomiting, dizziness and confusion. Labs suggestive of DKA, CT head also with incidental finding of prominent hydrocele with decompression of the 4th ventricle. PMH includes DMII, GERD, asthma.  Clinical Impression  Pt presents to PT with mild deficits in balance initially, which improve with increased ambulation during session. Pt with posterior loss of balance soon after standing which she corrects with stepping strategy. As pt is allowed to ambulate for increased time she tolerates multiple dynamic gait and balance challenges, maintaining balance with eyes closed, in single leg stance, and walking backward without significant balance deviation. Pt is encouraged to mobilize frequently at this time. PT recommends discharge home, no PT or DME needs.      If plan is discharge home, recommend the following: A little help with bathing/dressing/bathroom;Assistance with cooking/housework;Assist for transportation   Can travel by private vehicle        Equipment Recommendations None recommended by PT  Recommendations for Other Services       Functional Status Assessment Patient has had a recent decline in their functional status and demonstrates the ability to make significant improvements in function in a reasonable and predictable amount of time.     Precautions / Restrictions Precautions Precautions: Fall Restrictions Weight Bearing Restrictions: No      Mobility  Bed Mobility                    Transfers Overall transfer level: Independent                      Ambulation/Gait Ambulation/Gait assistance: Supervision Gait Distance (Feet): 400 Feet Assistive device: None Gait Pattern/deviations: Step-through  pattern Gait velocity: functional Gait velocity interpretation: >2.62 ft/sec, indicative of community ambulatory   General Gait Details: small posterior loss of balance with inital standing but otherwise tolerates dynamic gait challenges well including head turns, changes in stride length and gait speed, backward walking  Stairs            Wheelchair Mobility     Tilt Bed    Modified Rankin (Stroke Patients Only)       Balance Overall balance assessment: Needs assistance Sitting-balance support: No upper extremity supported, Feet supported Sitting balance-Leahy Scale: Good     Standing balance support: No upper extremity supported, During functional activity Standing balance-Leahy Scale: Good   Single Leg Stance - Right Leg: 10         Rhomberg - Eyes Closed: 30                 Pertinent Vitals/Pain Pain Assessment Pain Assessment: No/denies pain    Home Living Family/patient expects to be discharged to:: Private residence Living Arrangements: Spouse/significant other Available Help at Discharge: Family;Available PRN/intermittently Type of Home: House Home Access: Stairs to enter Entrance Stairs-Rails: Can reach both Entrance Stairs-Number of Steps: 4   Home Layout: One level Home Equipment: None      Prior Function Prior Level of Function : Independent/Modified Independent;Driving;Working/employed                     Extremity/Trunk Assessment   Upper Extremity Assessment Upper Extremity Assessment: Overall WFL for tasks assessed    Lower Extremity Assessment Lower Extremity Assessment: Overall WFL for tasks assessed  Cervical / Trunk Assessment Cervical / Trunk Assessment: Normal  Communication   Communication Communication: No apparent difficulties Cueing Techniques: Verbal cues  Cognition Arousal: Alert Behavior During Therapy: WFL for tasks assessed/performed Overall Cognitive Status: Within Functional Limits for tasks  assessed                                          General Comments General comments (skin integrity, edema, etc.): VSS on RA    Exercises     Assessment/Plan    PT Assessment Patient needs continued PT services  PT Problem List Decreased balance       PT Treatment Interventions Gait training;Balance training;Neuromuscular re-education    PT Goals (Current goals can be found in the Care Plan section)  Acute Rehab PT Goals Patient Stated Goal: to go home PT Goal Formulation: With patient Time For Goal Achievement: 10/26/22 Potential to Achieve Goals: Good Additional Goals Additional Goal #1: Pt will score >19/24 on the DGI to indicate a reduced risk for falls Additional Goal #2: Pt will score >45/56 on the BERG to indicate a reduced risk for falls    Frequency Min 1X/week     Co-evaluation               AM-PAC PT "6 Clicks" Mobility  Outcome Measure Help needed turning from your back to your side while in a flat bed without using bedrails?: None Help needed moving from lying on your back to sitting on the side of a flat bed without using bedrails?: None Help needed moving to and from a bed to a chair (including a wheelchair)?: None Help needed standing up from a chair using your arms (e.g., wheelchair or bedside chair)?: None Help needed to walk in hospital room?: A Little Help needed climbing 3-5 steps with a railing? : A Little 6 Click Score: 22    End of Session   Activity Tolerance: Patient tolerated treatment well Patient left: in chair;with call bell/phone within reach Nurse Communication: Mobility status PT Visit Diagnosis: Other abnormalities of gait and mobility (R26.89)    Time: 1610-9604 PT Time Calculation (min) (ACUTE ONLY): 17 min   Charges:   PT Evaluation $PT Eval Low Complexity: 1 Low   PT General Charges $$ ACUTE PT VISIT: 1 Visit         Arlyss Gandy, PT, DPT Acute Rehabilitation Office [phone]   Arlyss Gandy 10/12/2022, 10:06 AM

## 2022-10-12 NOTE — Discharge Instructions (Signed)
Discontinue Jardiance as this is likely contributing factor to your diagnosis of euglycemic diabetic ketoacidosis.  Recommend follow-up with PCP in 1-2 weeks following hospitalization.  FURTHER DISCHARGE INSTRUCTIONS:   Get Medicines reviewed and adjusted: Please take all your medications with you for your next visit with your Primary MD   Laboratory/radiological data: Please request your Primary MD to go over all hospital tests and procedure/radiological results at the follow up, please ask your Primary MD to get all Hospital records sent to his/her office.   In some cases, they will be blood work, cultures and biopsy results pending at the time of your discharge. Please request that your primary care M.D. goes through all the records of your hospital data and follows up on these results.   Also Note the following: If you experience worsening of your admission symptoms, develop shortness of breath, life threatening emergency, suicidal or homicidal thoughts you must seek medical attention immediately by calling 911 or calling your MD immediately  if symptoms less severe.   You must read complete instructions/literature along with all the possible adverse reactions/side effects for all the Medicines you take and that have been prescribed to you. Take any new Medicines after you have completely understood and accpet all the possible adverse reactions/side effects.    Do not drive when taking Pain medications or sleeping medications (Benzodaizepines)   Do not take more than prescribed Pain, Sleep and Anxiety Medications. It is not advisable to combine anxiety,sleep and pain medications without talking with your primary care practitioner   Special Instructions: If you have smoked or chewed Tobacco  in the last 2 yrs please stop smoking, stop any regular Alcohol  and or any Recreational drug use.   Wear Seat belts while driving.   Please note: You were cared for by a hospitalist during your  hospital stay. Once you are discharged, your primary care physician will handle any further medical issues. Please note that NO REFILLS for any discharge medications will be authorized once you are discharged, as it is imperative that you return to your primary care physician (or establish a relationship with a primary care physician if you do not have one) for your post hospital discharge needs so that they can reassess your need for medications and monitor your lab values.

## 2022-10-12 NOTE — Discharge Summary (Signed)
Physician Discharge Summary  Sue Dickson AOZ:[id number] DOB: 10-03-1960 DOA: 10/10/2022  PCP: Koren Shiver, DO  Admit date: 10/10/2022 Discharge date: 10/12/2022  Admitted From: Home Disposition: Home  Recommendations for Outpatient Follow-up:  Follow up with PCP in 1-2 weeks Discontinue Jardiance as likely etiology to her hospitalization causing euglycemic diabetic ketoacidosis  Home Health: No needs identified by therapy Equipment/Devices: None  Discharge Condition: Stable CODE STATUS: Full code Diet recommendation: Consistent carbohydrate diet  History of present illness:  Sue Dickson is a 62 year old female with past medical history significant for DM2, factor V Leiden mutation, GERD, exercise-induced asthma who presented to [facility], ED on 8/12 with generalized weakness, nausea/vomiting, dizziness, confusion.  Onset few days prior to admission.  Otherwise has been in her usual state of health prior.  Workup in the ED with labs suggestive of diabetic ketoacidosis.  Given confusion/AMS, CT head was obtained and demonstrated incidental finding of prominent hydrocele with decompression of the fourth ventricle with concerns of normal pressure hydrocephalus versus ventricular obstruction.  Neurology was consulted.  Unclear whether this was a new finding and whether was related to her DKA/acute illness with some concern for possible venous sinus thrombosis.  MRI brain was recommended and this demonstrated prominence of the lateral and third ventricles, at a proportion to cortical sulcation.  Given her neuroimaging findings, felt it would be best served with transfer to [facility] campus with admission to the neuro ICU for ongoing management/workup.  Patient was started on insulin, IV fluids, IV bicarbonate, and given empiric antibiotics.  Hospital course:  Euglycemic diabetic ketoacidosis Hx type 2 diabetes mellitus Acute metabolic encephalopathy: Patient presenting  to ED with confusion, dizziness, generalized weakness associate with nausea and vomiting.  Glucose 228, serum bicarb less than 7, lactic acid 3.4, beta hydroxybutyrate acid greater than 8.00.  Patient was started on IV insulin, IV fluids and IV bicarbonate drip with improvement.  Etiology likely secondary to Jardiance  [facility]) use outpatient.  Patient was weaned off of insulin drip and symptoms have resolved.  Tolerating advance diet.  Discussed with patient to discontinue Jardiance use on discharge.  Outpatient follow-up with PCP.  Continue metformin.  Sepsis ruled out WBC elevated 23 on admission, likely secondary to hemoconcentration in the setting of dehydration.  Initially started on vancomycin and cefepime, procalcitonin negative and antibiotics were discontinued.  Acute renal failure Creatinine 1.06 on admission, likely secondary to prerenal azotemia in the setting of dehydration from DKA as above.  Patient was supported with IV fluid hydration with improvement of creatinine to 0.61 at time of discharge.  Hypokalemia Hypophosphatemia Repleted during hospitalization.  Hydrocele with a compression of fourth ventricle Concern for hydrocephalus versus ventricular obstruction although no obstruction noted on MRI brain and favors NPH.  Neurology was consulted, Dr. Selina Cooley with no further recommendations and believes her presenting symptoms were all due to DKA as above.  Recommend outpatient follow-up with neurology as needed.  Discharge Diagnoses:  Principal Problem:   Acute metabolic encephalopathy Active Problems:   Diabetic ketoacidosis with coma associated with type 2 diabetes mellitus (HCC)   Hypophosphatemia   Hypokalemia   AKI (acute kidney injury) [facility])    Discharge Instructions  Discharge Instructions     Call MD for:  difficulty breathing, headache or visual disturbances   Complete by: As directed    Call MD for:  extreme fatigue   Complete by: As directed    Call MD for:   persistant dizziness or light-headedness   Complete  by: As directed    Call MD for:  persistant nausea and vomiting   Complete by: As directed    Call MD for:  severe uncontrolled pain   Complete by: As directed    Call MD for:  temperature >100.4   Complete by: As directed    Diet - low sodium heart healthy   Complete by: As directed    Increase activity slowly   Complete by: As directed       Allergies as of 10/12/2022   No Known Allergies      Medication List     STOP taking these medications    Jardiance 25 MG Tabs tablet Generic drug: empagliflozin       TAKE these medications    albuterol 108 (90 Base) MCG/ACT inhaler Commonly known as: VENTOLIN HFA Inhale 1-2 puffs into the lungs every 6 (six) hours as needed for wheezing or shortness of breath.   ALPRAZolam 0.25 MG tablet Commonly known as: XANAX Take 0.25 mg by mouth daily as needed for anxiety.   aspirin EC 81 MG tablet Take 81 mg by mouth at bedtime.   buPROPion 150 MG 24 hr tablet Commonly known as: WELLBUTRIN XL TAKE 150 MG BY MOUTH EVERY DAY IN THE MORNING   cetirizine 10 MG tablet Commonly known as: ZYRTEC Take 10 mg by mouth daily.   esomeprazole 20 MG capsule Commonly known as: NEXIUM Take 20 mg by mouth every morning.   hydrocortisone 2.5 % rectal cream Commonly known as: ANUSOL-HC Place 1 application rectally as needed for hemorrhoids or anal itching.   ibuprofen 200 MG tablet Commonly known as: ADVIL Take 400 mg by mouth every 6 (six) hours as needed for headache or moderate pain.   metFORMIN 500 MG 24 hr tablet Commonly known as: GLUCOPHAGE-XR Take 500 mg by mouth 2 (two) times daily.   Mounjaro 10 MG/0.5ML Pen Generic drug: tirzepatide Inject 10 mg into the skin once a week.   Mounjaro 10 MG/0.5ML Pen Generic drug: tirzepatide Inject 10 mg into the skin once a week.   omeprazole 20 MG capsule Commonly known as: PRILOSEC Take 20 mg by mouth every morning.    oxyCODONE-acetaminophen 5-325 MG tablet Commonly known as: PERCOCET/ROXICET Take 1 tablet by mouth every 6 (six) hours as needed for severe pain.   rosuvastatin 20 MG tablet Commonly known as: CRESTOR Take 20 mg by mouth daily.        Follow-up Information     Koren Shiver, DO. Schedule an appointment as soon as possible for a visit in 1 week(s).   Specialty: Family Medicine Contact information: [address] [place] [address] [phone]                No Known Allergies  Consultations: PCCM Neurology, case was discussed with Dr. Selina Cooley by PCCM   Procedures/Studies: MR BRAIN W WO CONTRAST  Result Date: 10/11/2022 CLINICAL DATA:  Initial evaluation for mental status change, unknown cause. EXAM: MRI HEAD WITHOUT AND WITH CONTRAST TECHNIQUE: Multiplanar, multiecho pulse sequences of the brain and surrounding structures were obtained without and with intravenous contrast. CONTRAST:  7mL GADAVIST GADOBUTROL 1 MMOL/ML IV SOLN COMPARISON:  Prior CT from earlier the same day. FINDINGS: Brain: Cerebral volume within normal limits. Confluent T2/FLAIR hyperintensity seen involving the periventricular white matter, nonspecific, but could reflect changes of chronic microvascular ischemic disease or possibly transependymal flow of CSF. No evidence for acute or subacute ischemia. Gray-white matter diffusion maintained. No  areas of chronic cortical infarction. No acute or chronic intracranial blood products. No mass lesion, midline shift or mass effect. Prominence of the lateral and third ventricles, somewhat out of proportion to cortical sulcation. Fourth ventricle is relatively normal in caliber. No extra-axial fluid collection. Pituitary gland suprasellar region within normal limits. No abnormal enhancement. Vascular: Major intracranial vascular flow voids are maintained. Skull and upper cervical spine: Craniocervical junction within normal limits. Bone marrow signal  intensity normal. No scalp soft tissue abnormality. Sinuses/Orbits: Globes and orbital soft tissues within normal limits. Paranasal sinuses are largely clear. No mastoid effusion. Other: None. IMPRESSION: 1. No acute intracranial abnormality. 2. Prominence of the lateral and third ventricles, out of proportion to cortical sulcation. Clinical correlation for possible normal pressure hydrocephalus recommended. 3. Confluent T2/FLAIR hyperintensity involving the periventricular white matter, nonspecific, but could reflect changes of chronic microvascular ischemic disease or transependymal flow of CSF. Electronically Signed   By: Rise Mu M.D.   On: 10/11/2022 00:42   CT Head Wo Contrast  Result Date: 10/10/2022 CLINICAL DATA:  Mental status change of unknown cause. Increasing weakness over the last couple of days. Nausea, vomiting, and dizziness. EXAM: CT HEAD WITHOUT CONTRAST TECHNIQUE: Contiguous axial images were obtained from the base of the skull through the vertex without intravenous contrast. RADIATION DOSE REDUCTION: This exam was performed according to the departmental dose-optimization program which includes automated exposure control, adjustment of the mA and/or kV according to patient size and/or use of iterative reconstruction technique. COMPARISON:  None Available. FINDINGS: Brain: Prominent hydrocephalus with diffuse enlargement of the lateral ventricles and third ventricle. Fourth ventricle is decompressed. Changes may represent normal pressure hydrocephalus or ventricular obstruction. MRI is suggested for further evaluation. Periventricular low-attenuation may represent small vessel ischemia or reabsorption of fluid. Gray-white matter junctions are distinct. No mass-effect or midline shift. No abnormal extra-axial fluid collections. Basal cisterns are not effaced. No acute intracranial hemorrhage. Vascular: No hyperdense vessel or unexpected calcification. Skull: Normal. Negative for  fracture or focal lesion. Sinuses/Orbits: No acute finding. Other: None. IMPRESSION: Prominent hydrocele this with decompression of the fourth ventricle. Consider normal pressure hydrocephalus versus ventricular obstruction. MRI is recommended for further evaluation. Electronically Signed   By: Burman Nieves M.D.   On: 10/10/2022 22:28   DG Chest 2 View  Result Date: 10/10/2022 CLINICAL DATA:  Suspected sepsis. Weakness, nausea and vomiting, and dizziness. EXAM: CHEST - 2 VIEW COMPARISON:  None Available. FINDINGS: The heart size and mediastinal contours are within normal limits. No consolidation, effusion, or pneumothorax. No acute osseous abnormality. IMPRESSION: No active cardiopulmonary disease. Electronically Signed   By: Thornell Sartorius M.D.   On: 10/10/2022 21:55     Subjective: Patient seen examined bedside, resting calmly.  Sitting upright in bed.  No specific complaints.  Daughter and husband present at bedside.  States ready for discharge home.  No other questions or concerns at this time.  Discussed with patient to discontinue Jardiance as likely precipitating factor to her DKA.  Denies headache, no dizziness, no chest pain, no palpitations, no shortness of breath, no abdominal pain, no fever/chills/night sweats, no nausea/vomiting/diarrhea, no focal weakness, no fatigue, no paresthesias.  No acute events overnight per nursing staff.  Discharge Exam: Vitals:   10/12/22 0741 10/12/22 0800  BP:  90/66  Pulse:  77  Resp:  18  Temp: 98.5 F (36.9 C)   SpO2:  100%   Vitals:   10/12/22 0600 10/12/22 0630 10/12/22 0741 10/12/22 0800  BP: 92/63  90/66  Pulse: 81 80  77  Resp: 20 18  18   Temp:   98.5 F (36.9 C)   TempSrc:   Axillary   SpO2: 99% 98%  100%  Weight:      Height:        Physical Exam: GEN: NAD, alert and oriented x 3, wd/wn HEENT: NCAT, PERRL, EOMI, sclera clear, MMM PULM: CTAB w/o wheezes/crackles, normal respiratory effort, on room air CV: RRR w/o M/G/R GI:  abd soft, NTND, NABS, no R/G/M MSK: no peripheral edema, muscle strength globally intact 5/5 bilateral upper/lower extremities NEURO: CN II-XII intact, no focal deficits, sensation to light touch intact PSYCH: normal mood/affect Integumentary: dry/intact, no rashes or wounds    The results of significant diagnostics from this hospitalization (including imaging, microbiology, ancillary and laboratory) are listed below for reference.     Microbiology: Recent Results (from the past 240 hour(s))  Culture, blood (Routine x 2)     Status: None (Preliminary result)   Collection Time: 10/10/22  9:33 PM   Specimen: BLOOD  Result Value Ref Range Status   Specimen Description   Final    BLOOD RIGHT ANTECUBITAL Performed at [facility], [address], [place], [place] [address]    Special Requests   Final    BOTTLES DRAWN AEROBIC AND ANAEROBIC Blood Culture adequate volume Performed at [facility], [address], [place], [place] [address]    Culture   Final    NO GROWTH 2 DAYS Performed at [facility], [address] [place], [place] [address]    Report Status PENDING  Incomplete  Resp panel by RT-PCR (RSV, Flu A&B, Covid) Anterior Nasal Swab     Status: None   Collection Time: 10/10/22 11:07 PM   Specimen: Anterior Nasal Swab  Result Value Ref Range Status   SARS Coronavirus 2 by RT PCR NEGATIVE NEGATIVE Final    Comment: (NOTE) SARS-CoV-2 target nucleic acids are NOT DETECTED.  The SARS-CoV-2 RNA is generally detectable in upper respiratory specimens during the acute phase of infection. The lowest concentration of SARS-CoV-2 viral copies this assay can detect is 138 copies/mL. A negative result does not preclude SARS-Cov-2 infection and should not be used as the sole basis for treatment or other patient management decisions. A negative result may occur with  improper specimen collection/handling, submission of specimen other than  nasopharyngeal swab, presence of viral mutation(s) within the areas targeted by this assay, and inadequate number of viral copies(<138 copies/mL). A negative result must be combined with clinical observations, patient history, and epidemiological information. The expected result is Negative.  Fact Sheet for Patients:  BloggerCourse.com  Fact Sheet for Healthcare Providers:  SeriousBroker.it  This test is no t yet approved or cleared by the [place] FDA and  has been authorized for detection and/or diagnosis of SARS-CoV-2 by FDA under an Emergency Use Authorization (EUA). This EUA will remain  in effect (meaning this test can be used) for the duration of the COVID-19 declaration under Section 564(b)(1) of the Act, 21 U.S.C.section 360bbb-3(b)(1), unless the authorization is terminated  or revoked sooner.       Influenza A by PCR NEGATIVE NEGATIVE Final   Influenza B by PCR NEGATIVE NEGATIVE Final    Comment: (NOTE) The Xpert Xpress SARS-CoV-2/FLU/RSV plus assay is intended as an aid in the diagnosis of influenza from Nasopharyngeal swab specimens and should not be used as a sole basis for treatment. Nasal washings and aspirates  are unacceptable for Xpert Xpress SARS-CoV-2/FLU/RSV testing.  Fact Sheet for Patients: BloggerCourse.com  Fact Sheet for Healthcare Providers: SeriousBroker.it  This test is not yet approved or cleared by the [place] FDA and has been authorized for detection and/or diagnosis of SARS-CoV-2 by FDA under an Emergency Use Authorization (EUA). This EUA will remain in effect (meaning this test can be used) for the duration of the COVID-19 declaration under Section 564(b)(1) of the Act, 21 U.S.C. section 360bbb-3(b)(1), unless the authorization is terminated or revoked.     Resp Syncytial Virus by PCR NEGATIVE NEGATIVE Final    Comment:  (NOTE) Fact Sheet for Patients: BloggerCourse.com  Fact Sheet for Healthcare Providers: SeriousBroker.it  This test is not yet approved or cleared by the [place] FDA and has been authorized for detection and/or diagnosis of SARS-CoV-2 by FDA under an Emergency Use Authorization (EUA). This EUA will remain in effect (meaning this test can be used) for the duration of the COVID-19 declaration under Section 564(b)(1) of the Act, 21 U.S.C. section 360bbb-3(b)(1), unless the authorization is terminated or revoked.  Performed at [facility], [address], [place], [place] [address]   Culture, blood (Routine x 2)     Status: None (Preliminary result)   Collection Time: 10/11/22 12:54 AM   Specimen: BLOOD  Result Value Ref Range Status   Specimen Description   Final    BLOOD SITE NOT SPECIFIED Performed at [facility], [address], [place], [place] [address]    Special Requests   Final    BOTTLES DRAWN AEROBIC AND ANAEROBIC Blood Culture results may not be optimal due to an inadequate volume of blood received in culture bottles Performed at [facility], [address] [place], [place] [address]    Culture   Final    NO GROWTH 1 DAY Performed at [facility], [address], [place], [place] [address]    Report Status PENDING  Incomplete  MRSA Next Gen by PCR, Nasal     Status: None   Collection Time: 10/11/22 10:41 AM   Specimen: Nasal Mucosa; Nasal Swab  Result Value Ref Range Status   MRSA by PCR Next Gen NOT DETECTED NOT DETECTED Final    Comment: (NOTE) The GeneXpert MRSA Assay (FDA approved for NASAL specimens only), is one component of a comprehensive MRSA colonization surveillance program. It is not intended to diagnose MRSA infection nor to guide or monitor treatment for MRSA infections. Test performance is not FDA approved in patients less than 85  years old. Performed at [facility], [address] [place], [place] [address]      Labs: BNP (last 3 results) No results for input(s): "BNP" in the last 8760 hours. Basic Metabolic Panel: Recent Labs  Lab 10/11/22 0648 10/11/22 1251 10/11/22 1657 10/11/22 2116 10/12/22 0016 10/12/22 0809  NA 139 137 135 138 138 141  K 3.0* 4.0 3.0* 3.9 4.0 3.2*  CL 104 112* 111 108 110 110  CO2 20* 13* 15* 16* 16* 20*  GLUCOSE 104* 156* 111* 135* 153* 123*  BUN 15 13 10 8 6  5*  CREATININE 0.58 0.74 0.60 0.62 0.59 0.61  CALCIUM 7.3* 8.8* 9.0 9.4 9.1 9.2  MG 2.0  --   --   --   --  1.9  PHOS <1.0*  <1.0*  --  <1.0*  --   --  2.5   Liver Function Tests: Recent Labs  Lab 10/10/22  2133  AST 18  ALT 25  ALKPHOS 121  BILITOT 1.3*  PROT 8.7*  ALBUMIN 5.0   Recent Labs  Lab 10/10/22 2133 10/11/22 0648  LIPASE 161* 381*   No results for input(s): "AMMONIA" in the last 168 hours. CBC: Recent Labs  Lab 10/10/22 2133 10/11/22 0648  WBC 23.1* 13.5*  NEUTROABS 19.3*  --   HGB 16.5* 11.9*  HCT 53.9* 37.9  MCV 87.5 85.2  PLT 523* 240   Cardiac Enzymes: No results for input(s): "CKTOTAL", "CKMB", "CKMBINDEX", "TROPONINI" in the last 168 hours. BNP: Invalid input(s): "POCBNP" CBG: Recent Labs  Lab 10/11/22 2141 10/11/22 2240 10/11/22 2348 10/12/22 0338 10/12/22 0740  GLUCAP 110* 123* 129* 129* 119*   D-Dimer No results for input(s): "DDIMER" in the last 72 hours. Hgb A1c Recent Labs    10/11/22 0054  HGBA1C 6.8*   Lipid Profile No results for input(s): "CHOL", "HDL", "LDLCALC", "TRIG", "CHOLHDL", "LDLDIRECT" in the last 72 hours. Thyroid function studies No results for input(s): "TSH", "T4TOTAL", "T3FREE", "THYROIDAB" in the last 72 hours.  Invalid input(s): "FREET3" Anemia work up No results for input(s): "VITAMINB12", "FOLATE", "FERRITIN", "TIBC", "IRON", "RETICCTPCT" in the last 72 hours. Urinalysis    Component Value Date/Time   COLORURINE STRAW  (A) 10/10/2022 2310   APPEARANCEUR HAZY (A) 10/10/2022 2310   LABSPEC 1.018 10/10/2022 2310   PHURINE 5.0 10/10/2022 2310   GLUCOSEU >=500 (A) 10/10/2022 2310   HGBUR MODERATE (A) 10/10/2022 2310   BILIRUBINUR NEGATIVE 10/10/2022 2310   KETONESUR 80 (A) 10/10/2022 2310   PROTEINUR 100 (A) 10/10/2022 2310   NITRITE NEGATIVE 10/10/2022 2310   LEUKOCYTESUR NEGATIVE 10/10/2022 2310   Sepsis Labs Recent Labs  Lab 10/10/22 2133 10/11/22 0648  WBC 23.1* 13.5*   Microbiology Recent Results (from the past 240 hour(s))  Culture, blood (Routine x 2)     Status: None (Preliminary result)   Collection Time: 10/10/22  9:33 PM   Specimen: BLOOD  Result Value Ref Range Status   Specimen Description   Final    BLOOD RIGHT ANTECUBITAL Performed at [facility], [address] [place], [place] [address]    Special Requests   Final    BOTTLES DRAWN AEROBIC AND ANAEROBIC Blood Culture adequate volume Performed at [facility], [address], [place], [place] [address]    Culture   Final    NO GROWTH 2 DAYS Performed at [facility], [address] [place], [place] [address]    Report Status PENDING  Incomplete  Resp panel by RT-PCR (RSV, Flu A&B, Covid) Anterior Nasal Swab     Status: None   Collection Time: 10/10/22 11:07 PM   Specimen: Anterior Nasal Swab  Result Value Ref Range Status   SARS Coronavirus 2 by RT PCR NEGATIVE NEGATIVE Final    Comment: (NOTE) SARS-CoV-2 target nucleic acids are NOT DETECTED.  The SARS-CoV-2 RNA is generally detectable in upper respiratory specimens during the acute phase of infection. The lowest concentration of SARS-CoV-2 viral copies this assay can detect is 138 copies/mL. A negative result does not preclude SARS-Cov-2 infection and should not be used as the sole basis for treatment or other patient management decisions. A negative result may occur with  improper specimen collection/handling, submission  of specimen other than nasopharyngeal swab, presence of viral mutation(s) within the areas targeted by this assay, and inadequate number of viral copies(<138 copies/mL). A negative result must be combined with clinical observations, patient history, and  epidemiological information. The expected result is Negative.  Fact Sheet for Patients:  BloggerCourse.com  Fact Sheet for Healthcare Providers:  SeriousBroker.it  This test is no t yet approved or cleared by the [place] FDA and  has been authorized for detection and/or diagnosis of SARS-CoV-2 by FDA under an Emergency Use Authorization (EUA). This EUA will remain  in effect (meaning this test can be used) for the duration of the COVID-19 declaration under Section 564(b)(1) of the Act, 21 U.S.C.section 360bbb-3(b)(1), unless the authorization is terminated  or revoked sooner.       Influenza A by PCR NEGATIVE NEGATIVE Final   Influenza B by PCR NEGATIVE NEGATIVE Final    Comment: (NOTE) The Xpert Xpress SARS-CoV-2/FLU/RSV plus assay is intended as an aid in the diagnosis of influenza from Nasopharyngeal swab specimens and should not be used as a sole basis for treatment. Nasal washings and aspirates are unacceptable for Xpert Xpress SARS-CoV-2/FLU/RSV testing.  Fact Sheet for Patients: BloggerCourse.com  Fact Sheet for Healthcare Providers: SeriousBroker.it  This test is not yet approved or cleared by the [place] FDA and has been authorized for detection and/or diagnosis of SARS-CoV-2 by FDA under an Emergency Use Authorization (EUA). This EUA will remain in effect (meaning this test can be used) for the duration of the COVID-19 declaration under Section 564(b)(1) of the Act, 21 U.S.C. section 360bbb-3(b)(1), unless the authorization is terminated or revoked.     Resp Syncytial Virus by PCR NEGATIVE NEGATIVE  Final    Comment: (NOTE) Fact Sheet for Patients: BloggerCourse.com  Fact Sheet for Healthcare Providers: SeriousBroker.it  This test is not yet approved or cleared by the [place] FDA and has been authorized for detection and/or diagnosis of SARS-CoV-2 by FDA under an Emergency Use Authorization (EUA). This EUA will remain in effect (meaning this test can be used) for the duration of the COVID-19 declaration under Section 564(b)(1) of the Act, 21 U.S.C. section 360bbb-3(b)(1), unless the authorization is terminated or revoked.  Performed at [facility], [address] [place], [place] [address]   Culture, blood (Routine x 2)     Status: None (Preliminary result)   Collection Time: 10/11/22 12:54 AM   Specimen: BLOOD  Result Value Ref Range Status   Specimen Description   Final    BLOOD SITE NOT SPECIFIED Performed at [facility], [address], [place], [place] [address]    Special Requests   Final    BOTTLES DRAWN AEROBIC AND ANAEROBIC Blood Culture results may not be optimal due to an inadequate volume of blood received in culture bottles Performed at [facility], [address] [place], [place] [address]    Culture   Final    NO GROWTH 1 DAY Performed at [facility], [address], [place], [place] [address]    Report Status PENDING  Incomplete  MRSA Next Gen by PCR, Nasal     Status: None   Collection Time: 10/11/22 10:41 AM   Specimen: Nasal Mucosa; Nasal Swab  Result Value Ref Range Status   MRSA by PCR Next Gen NOT DETECTED NOT DETECTED Final    Comment: (NOTE) The GeneXpert MRSA Assay (FDA approved for NASAL specimens only), is one component of a comprehensive MRSA colonization surveillance program. It is not intended to diagnose MRSA infection nor to guide or monitor treatment for MRSA infections. Test performance is not FDA approved in  patients less than 90 years old. Performed at  [facility], [address] [place] [address] [place], [place] [address]      Time coordinating discharge: Over 30 minutes  SIGNED:   Alvira Philips [place], DO  [facility] 10/12/2022, 10:33 AM

## 2022-11-07 ENCOUNTER — Other Ambulatory Visit (HOSPITAL_BASED_OUTPATIENT_CLINIC_OR_DEPARTMENT_OTHER): Payer: Self-pay

## 2023-01-24 ENCOUNTER — Other Ambulatory Visit (HOSPITAL_BASED_OUTPATIENT_CLINIC_OR_DEPARTMENT_OTHER): Payer: Self-pay

## 2023-01-24 MED ORDER — MOUNJARO 7.5 MG/0.5ML ~~LOC~~ SOAJ
7.5000 mg | SUBCUTANEOUS | 1 refills | Status: AC
  Filled 2023-01-24: qty 2, 28d supply, fill #0
  Filled 2023-02-22: qty 2, 28d supply, fill #1

## 2023-01-30 ENCOUNTER — Other Ambulatory Visit (HOSPITAL_BASED_OUTPATIENT_CLINIC_OR_DEPARTMENT_OTHER): Payer: Self-pay

## 2023-01-30 ENCOUNTER — Other Ambulatory Visit (HOSPITAL_COMMUNITY): Payer: Self-pay

## 2023-01-31 ENCOUNTER — Other Ambulatory Visit (HOSPITAL_BASED_OUTPATIENT_CLINIC_OR_DEPARTMENT_OTHER): Payer: Self-pay

## 2023-02-01 ENCOUNTER — Other Ambulatory Visit (HOSPITAL_BASED_OUTPATIENT_CLINIC_OR_DEPARTMENT_OTHER): Payer: Self-pay

## 2023-02-23 ENCOUNTER — Other Ambulatory Visit: Payer: Self-pay

## 2023-02-24 ENCOUNTER — Other Ambulatory Visit: Payer: Self-pay
